# Patient Record
Sex: Female | Born: 1980 | Race: White | Hispanic: No | Marital: Single | Smoking: Current every day smoker
Health system: Southern US, Community
[De-identification: ages and names within clinical notes are randomized; demographics above are authoritative.]

## PROBLEM LIST (undated history)

## (undated) DIAGNOSIS — M8700 Idiopathic aseptic necrosis of unspecified bone: Secondary | ICD-10-CM

## (undated) DIAGNOSIS — J45909 Unspecified asthma, uncomplicated: Secondary | ICD-10-CM

## (undated) DIAGNOSIS — F319 Bipolar disorder, unspecified: Secondary | ICD-10-CM

## (undated) DIAGNOSIS — M25559 Pain in unspecified hip: Secondary | ICD-10-CM

## (undated) DIAGNOSIS — R748 Abnormal levels of other serum enzymes: Secondary | ICD-10-CM

## (undated) DIAGNOSIS — F32A Depression, unspecified: Secondary | ICD-10-CM

## (undated) DIAGNOSIS — G8929 Other chronic pain: Secondary | ICD-10-CM

## (undated) DIAGNOSIS — J189 Pneumonia, unspecified organism: Secondary | ICD-10-CM

## (undated) DIAGNOSIS — M549 Dorsalgia, unspecified: Secondary | ICD-10-CM

## (undated) DIAGNOSIS — R569 Unspecified convulsions: Secondary | ICD-10-CM

## (undated) DIAGNOSIS — F4310 Post-traumatic stress disorder, unspecified: Secondary | ICD-10-CM

## (undated) DIAGNOSIS — M1990 Unspecified osteoarthritis, unspecified site: Secondary | ICD-10-CM

## (undated) DIAGNOSIS — N289 Disorder of kidney and ureter, unspecified: Secondary | ICD-10-CM

## (undated) DIAGNOSIS — J42 Unspecified chronic bronchitis: Secondary | ICD-10-CM

## (undated) DIAGNOSIS — M5030 Other cervical disc degeneration, unspecified cervical region: Secondary | ICD-10-CM

## (undated) DIAGNOSIS — F419 Anxiety disorder, unspecified: Secondary | ICD-10-CM

## (undated) DIAGNOSIS — F329 Major depressive disorder, single episode, unspecified: Secondary | ICD-10-CM

## (undated) HISTORY — PX: JOINT REPLACEMENT: SHX530

## (undated) HISTORY — PX: WISDOM TOOTH EXTRACTION: SHX21

## (undated) HISTORY — PX: DILATION AND CURETTAGE OF UTERUS: SHX78

---
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
## 2013-04-16 DIAGNOSIS — J189 Pneumonia, unspecified organism: Secondary | ICD-10-CM

## 2013-04-16 HISTORY — DX: Pneumonia, unspecified organism: J18.9

## 2013-06-17 ENCOUNTER — Encounter (HOSPITAL_COMMUNITY): Payer: Self-pay | Admitting: Emergency Medicine

## 2013-06-17 ENCOUNTER — Emergency Department (EMERGENCY_DEPARTMENT_HOSPITAL)
Admission: EM | Admit: 2013-06-17 | Discharge: 2013-06-18 | Disposition: A | Discharge status: Home / Self Care | Payer: MEDICAID | Source: Home / Self Care | Attending: Emergency Medicine | Admitting: Emergency Medicine

## 2013-06-17 DIAGNOSIS — F3289 Other specified depressive episodes: Secondary | ICD-10-CM | POA: Insufficient documentation

## 2013-06-17 DIAGNOSIS — F17200 Nicotine dependence, unspecified, uncomplicated: Secondary | ICD-10-CM

## 2013-06-17 DIAGNOSIS — F329 Major depressive disorder, single episode, unspecified: Secondary | ICD-10-CM

## 2013-06-17 DIAGNOSIS — IMO0002 Reserved for concepts with insufficient information to code with codable children: Secondary | ICD-10-CM

## 2013-06-17 DIAGNOSIS — F332 Major depressive disorder, recurrent severe without psychotic features: Secondary | ICD-10-CM | POA: Diagnosis not present

## 2013-06-17 DIAGNOSIS — R45850 Homicidal ideations: Secondary | ICD-10-CM

## 2013-06-17 DIAGNOSIS — R45851 Suicidal ideations: Secondary | ICD-10-CM

## 2013-06-17 DIAGNOSIS — F39 Unspecified mood [affective] disorder: Secondary | ICD-10-CM

## 2013-06-17 DIAGNOSIS — F4310 Post-traumatic stress disorder, unspecified: Secondary | ICD-10-CM

## 2013-06-17 DIAGNOSIS — F411 Generalized anxiety disorder: Secondary | ICD-10-CM

## 2013-06-17 DIAGNOSIS — F3189 Other bipolar disorder: Secondary | ICD-10-CM | POA: Insufficient documentation

## 2013-06-17 HISTORY — DX: Bipolar disorder, unspecified: F31.9

## 2013-06-17 HISTORY — DX: Depression, unspecified: F32.A

## 2013-06-17 HISTORY — DX: Post-traumatic stress disorder, unspecified: F43.10

## 2013-06-17 HISTORY — DX: Major depressive disorder, single episode, unspecified: F32.9

## 2013-06-17 LAB — RAPID URINE DRUG SCREEN, HOSP PERFORMED
AMPHETAMINES: NOT DETECTED
BENZODIAZEPINES: POSITIVE — AB
Barbiturates: NOT DETECTED
Cocaine: NOT DETECTED
OPIATES: NOT DETECTED
Tetrahydrocannabinol: NOT DETECTED

## 2013-06-17 LAB — SALICYLATE LEVEL: Salicylate Lvl: <2 mg/dL — ABNORMAL LOW (ref 2.8–20.0)

## 2013-06-17 LAB — CBC
HCT: 38.2 % (ref 36.0–46.0)
Hemoglobin: 13 g/dL (ref 12.0–15.0)
MCH: 32.6 pg (ref 26.0–34.0)
MCHC: 34 g/dL (ref 30.0–36.0)
MCV: 95.7 fL (ref 78.0–100.0)
PLATELETS: 294 10*3/uL (ref 150–400)
RBC: 3.99 MIL/uL (ref 3.87–5.11)
RDW: 13.6 % (ref 11.5–15.5)
WBC: 8.6 10*3/uL (ref 4.0–10.5)

## 2013-06-17 LAB — COMPREHENSIVE METABOLIC PANEL
ALT: 18 U/L (ref 0–35)
AST: 28 U/L (ref 0–37)
Albumin: 4 g/dL (ref 3.5–5.2)
Alkaline Phosphatase: 60 U/L (ref 39–117)
BUN: 8 mg/dL (ref 6–23)
CALCIUM: 9.1 mg/dL (ref 8.4–10.5)
CO2: 25 mEq/L (ref 19–32)
Chloride: 110 mEq/L (ref 96–112)
Creatinine, Ser: 0.86 mg/dL (ref 0.50–1.10)
GFR calc Af Amer: >90 mL/min (ref >90)
GFR calc non Af Amer: 89 mL/min — ABNORMAL LOW (ref >90)
Glucose, Bld: 77 mg/dL (ref 70–99)
Potassium: 4.2 mEq/L (ref 3.7–5.3)
Sodium: 148 mEq/L — ABNORMAL HIGH (ref 137–147)
Total Bilirubin: <0.2 mg/dL — ABNORMAL LOW (ref 0.3–1.2)
Total Protein: 7 g/dL (ref 6.0–8.3)

## 2013-06-17 LAB — ACETAMINOPHEN LEVEL: Acetaminophen (Tylenol), Serum: <15 ug/mL (ref 10–30)

## 2013-06-17 LAB — ETHANOL

## 2013-06-17 LAB — PREGNANCY, URINE: Preg Test, Ur: NEGATIVE

## 2013-06-17 MED ORDER — LORAZEPAM 1 MG PO TABS
1.0000 mg | ORAL_TABLET | Freq: Once | ORAL | Status: AC
  Administered 2013-06-17: 1 mg via ORAL
  Filled 2013-06-17: qty 1

## 2013-06-17 MED ORDER — BUSPIRONE HCL 10 MG PO TABS
15.0000 mg | ORAL_TABLET | Freq: Three times a day (TID) | ORAL | Status: DC
  Administered 2013-06-17: 15 mg via ORAL
  Filled 2013-06-17: qty 2

## 2013-06-17 MED ORDER — ACETAMINOPHEN 325 MG PO TABS: 650.0000 mg | ORAL_TABLET | ORAL | Status: DC | PRN

## 2013-06-17 MED ORDER — VITAMIN B-12 100 MCG PO TABS
50.0000 ug | ORAL_TABLET | Freq: Every day | ORAL | Status: DC
  Administered 2013-06-17 – 2013-06-18 (×2): 50 ug via ORAL
  Filled 2013-06-17 (×2): qty 1

## 2013-06-17 MED ORDER — BIOTIN 1 MG PO CAPS: 1.0000 mg | ORAL_CAPSULE | Freq: Every day | ORAL | Status: DC

## 2013-06-17 MED ORDER — CLONAZEPAM 1 MG PO TABS
2.0000 mg | ORAL_TABLET | Freq: Four times a day (QID) | ORAL | Status: DC
  Administered 2013-06-17: 2 mg via ORAL
  Filled 2013-06-17: qty 2

## 2013-06-17 MED ORDER — IBUPROFEN 200 MG PO TABS
600.0000 mg | ORAL_TABLET | Freq: Three times a day (TID) | ORAL | Status: DC | PRN
  Administered 2013-06-18 (×2): 600 mg via ORAL
  Filled 2013-06-17 (×2): qty 3

## 2013-06-17 MED ORDER — PRENATAL MULTIVITAMIN CH
1.0000 | ORAL_TABLET | Freq: Every day | ORAL | Status: DC
  Administered 2013-06-17 – 2013-06-18 (×2): 1 via ORAL
  Filled 2013-06-17 (×2): qty 1

## 2013-06-17 MED ORDER — ALUM & MAG HYDROXIDE-SIMETH 200-200-20 MG/5ML PO SUSP: 30.0000 mL | ORAL | Status: DC | PRN

## 2013-06-17 MED ORDER — PSEUDOEPHEDRINE HCL 30 MG PO TABS
30.0000 mg | ORAL_TABLET | ORAL | Status: DC | PRN
  Filled 2013-06-17: qty 1

## 2013-06-17 MED ORDER — ZOLPIDEM TARTRATE 5 MG PO TABS
5.0000 mg | ORAL_TABLET | Freq: Once | ORAL | Status: AC
  Administered 2013-06-17: 5 mg via ORAL
  Filled 2013-06-17: qty 1

## 2013-06-17 MED ORDER — B COMPLEX-C PO TABS
1.0000 | ORAL_TABLET | Freq: Every day | ORAL | Status: DC
  Administered 2013-06-17 – 2013-06-18 (×2): 1 via ORAL
  Filled 2013-06-17 (×2): qty 1

## 2013-06-17 MED ORDER — BUSPIRONE HCL 10 MG PO TABS
10.0000 mg | ORAL_TABLET | Freq: Three times a day (TID) | ORAL | Status: DC
  Administered 2013-06-17 – 2013-06-18 (×4): 10 mg via ORAL
  Filled 2013-06-17 (×4): qty 1

## 2013-06-17 MED ORDER — NICOTINE 21 MG/24HR TD PT24
21.0000 mg | MEDICATED_PATCH | Freq: Every day | TRANSDERMAL | Status: DC
  Administered 2013-06-17 – 2013-06-18 (×2): 21 mg via TRANSDERMAL
  Filled 2013-06-17 (×2): qty 1

## 2013-06-17 MED ORDER — MAGNESIUM OXIDE 400 (241.3 MG) MG PO TABS
30.0000 mg | ORAL_TABLET | Freq: Every day | ORAL | Status: DC
  Administered 2013-06-17: 400 mg via ORAL
  Administered 2013-06-18: 200 mg via ORAL
  Filled 2013-06-17 (×2): qty 0.5

## 2013-06-17 MED ORDER — ONDANSETRON HCL 4 MG PO TABS: 4.0000 mg | ORAL_TABLET | Freq: Three times a day (TID) | ORAL | Status: DC | PRN

## 2013-06-17 MED ORDER — LAMOTRIGINE 200 MG PO TABS
200.0000 mg | ORAL_TABLET | Freq: Every day | ORAL | Status: DC
  Administered 2013-06-17 – 2013-06-18 (×2): 200 mg via ORAL
  Filled 2013-06-17 (×2): qty 1

## 2013-06-17 MED ORDER — TOPIRAMATE 25 MG PO TABS
100.0000 mg | ORAL_TABLET | Freq: Every day | ORAL | Status: DC
  Administered 2013-06-17: 100 mg via ORAL
  Filled 2013-06-17 (×2): qty 4

## 2013-06-17 MED ORDER — CLONAZEPAM 1 MG PO TABS
1.0000 mg | ORAL_TABLET | Freq: Four times a day (QID) | ORAL | Status: DC | PRN
  Administered 2013-06-17 – 2013-06-18 (×4): 1 mg via ORAL
  Filled 2013-06-17 (×4): qty 1

## 2013-06-17 NOTE — ED Notes (Signed)
Patient medications reviewed. Patient tearful and irritable with this writer concerning doses of medications.  "You don't understand, they cut my doses of medicine in half. I had been on them for three years."  Support and encouragement offerd.  Refusing food.

## 2013-06-17 NOTE — ED Notes (Signed)
Patient changed into paper scrubs, green booties.  All belongings in 3 plastic bags.  Patient moved to triage 4, wanded by security.

## 2013-06-17 NOTE — ED Notes (Signed)
Patient escorted to the unit by [facility] driver.  Gait steady.  Pt cooperates with the assessment. However, she is irritable and agitated.  Denies SI/HI at this time.  Endorses visual hallucinations.  Reports "seeing deceased people from her past...sort of like zombies but not really".  She also reports attempting to sit in a chair that was not actually there and falling because of that.  Denies auditory hallucinations. She stares off into the distance as if seeing or hearing something.  She has a blunted affect and monotone voice.  Reports having a headache for the past month that has not resolved.  States that she usually takes Motrin.  Patients medications have not been loaded at this time therefore, she has not been medicated for the HA.

## 2013-06-17 NOTE — ED Notes (Signed)
Stated she has been more depressed within the last year
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
## 2013-06-17 NOTE — ED Notes (Signed)
Patient is focused on her medication dosing and timing. Patient reports that she needs her medications to match her home schedule because "my body has been on the same schedule for three years". Patient states that the previous shift nurse "messed up my meds" by not giving her what she wanted when she wanted. Patient states that no doctor discussed her medications/schedule with her. Patient requests a complaint form. States that she will not be "stabilized" until her medications are right.  Encouragement offered. Nicotine Patch given.  Patient safety maintained, Q15 checks continue.

## 2013-06-17 NOTE — Progress Notes (Signed)
PHARMACIST - PHYSICIAN ORDER COMMUNICATION  CONCERNING: P&T Medication Policy on Herbal Medications  DESCRIPTION:  This patient's order for:  Biotin  has been noted.  This product(s) is classified as an "herbal" or natural product. Due to a lack of definitive safety studies or FDA approval, nonstandard manufacturing practices, plus the potential risk of unknown drug-drug interactions while on inpatient medications, the Pharmacy and Therapeutics Committee does not permit the use of "herbal" or natural products of this type within [facility].   ACTION TAKEN: The pharmacy department is unable to verify this order at this time and your patient has been informed of this safety policy. Please reevaluate patient's clinical condition at discharge and address if the herbal or natural product(s) should be resumed at that time.  Tomy Khim, Loma MessingMary Patricia PharmD Pager #: [phone] 7:28 AM 06/17/2013

## 2013-06-17 NOTE — Consult Note (Signed)
Face to face evaluation and I agree with this note 
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
## 2013-06-17 NOTE — ED Notes (Signed)
Pt sts she can "probably" contract for safety
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
## 2013-06-17 NOTE — ED Notes (Signed)
[facility]

## 2013-06-17 NOTE — ED Notes (Signed)
Patient with flat affect.
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
## 2013-06-17 NOTE — ED Notes (Signed)
House coverage and charge RN aware of need for sitter 

## 2013-06-17 NOTE — ED Notes (Signed)
Patient became angry with this Clinical research associatewriter when informed of inpatient information. Demanded medication schedule changes.  Demanded an inpatient private room.  Twisted conversation to make it appear i had belittled her.  Informed patient that she needed to ask for klonopin now that it was PRN. Patient irritable and entitled.

## 2013-06-17 NOTE — ED Notes (Signed)
Call [facility] transport service [facility]

## 2013-06-17 NOTE — ED Notes (Signed)
Pt requesting eval with in person psychiatrist and for area without sitter present; informed pt that could not be provided at this facility

## 2013-06-18 ENCOUNTER — Encounter (HOSPITAL_COMMUNITY): Payer: Self-pay

## 2013-06-18 ENCOUNTER — Inpatient Hospital Stay (HOSPITAL_COMMUNITY)
Admission: AD | Admit: 2013-06-18 | Discharge: 2013-06-24 | DRG: 885 | Disposition: A | Discharge status: Home / Self Care | Payer: MEDICAID | Source: Intra-hospital | Attending: Psychiatry | Admitting: Psychiatry

## 2013-06-18 DIAGNOSIS — F4312 Post-traumatic stress disorder, chronic: Secondary | ICD-10-CM

## 2013-06-18 DIAGNOSIS — R45850 Homicidal ideations: Secondary | ICD-10-CM

## 2013-06-18 DIAGNOSIS — F410 Panic disorder [episodic paroxysmal anxiety] without agoraphobia: Secondary | ICD-10-CM | POA: Diagnosis present

## 2013-06-18 DIAGNOSIS — F4310 Post-traumatic stress disorder, unspecified: Secondary | ICD-10-CM | POA: Diagnosis present

## 2013-06-18 DIAGNOSIS — Z23 Encounter for immunization: Secondary | ICD-10-CM

## 2013-06-18 DIAGNOSIS — F329 Major depressive disorder, single episode, unspecified: Secondary | ICD-10-CM | POA: Diagnosis present

## 2013-06-18 DIAGNOSIS — G4700 Insomnia, unspecified: Secondary | ICD-10-CM | POA: Diagnosis present

## 2013-06-18 DIAGNOSIS — IMO0002 Reserved for concepts with insufficient information to code with codable children: Secondary | ICD-10-CM | POA: Diagnosis not present

## 2013-06-18 DIAGNOSIS — F332 Major depressive disorder, recurrent severe without psychotic features: Secondary | ICD-10-CM

## 2013-06-18 DIAGNOSIS — R45851 Suicidal ideations: Secondary | ICD-10-CM | POA: Diagnosis not present

## 2013-06-18 DIAGNOSIS — F319 Bipolar disorder, unspecified: Secondary | ICD-10-CM | POA: Diagnosis present

## 2013-06-18 DIAGNOSIS — F411 Generalized anxiety disorder: Secondary | ICD-10-CM | POA: Diagnosis present

## 2013-06-18 DIAGNOSIS — F17200 Nicotine dependence, unspecified, uncomplicated: Secondary | ICD-10-CM | POA: Diagnosis present

## 2013-06-18 MED ORDER — INFLUENZA VAC SPLIT QUAD 0.5 ML IM SUSP
0.5000 mL | INTRAMUSCULAR | Status: DC
  Filled 2013-06-18: qty 0.5

## 2013-06-18 MED ORDER — MAGNESIUM HYDROXIDE 400 MG/5ML PO SUSP: 30.0000 mL | Freq: Every day | ORAL | Status: DC | PRN

## 2013-06-18 MED ORDER — PRENATAL MULTIVITAMIN CH
1.0000 | ORAL_TABLET | Freq: Every day | ORAL | Status: DC
  Administered 2013-06-19 – 2013-06-24 (×5): 1 via ORAL
  Filled 2013-06-18 (×7): qty 1

## 2013-06-18 MED ORDER — CLONAZEPAM 1 MG PO TABS
1.0000 mg | ORAL_TABLET | Freq: Four times a day (QID) | ORAL | Status: DC | PRN
  Administered 2013-06-18 – 2013-06-24 (×18): 1 mg via ORAL
  Filled 2013-06-18 (×20): qty 1

## 2013-06-18 MED ORDER — MAGNESIUM OXIDE 400 (241.3 MG) MG PO TABS: 30.0000 mg | ORAL_TABLET | Freq: Every day | ORAL | Status: DC

## 2013-06-18 MED ORDER — B COMPLEX-C PO TABS
1.0000 | ORAL_TABLET | Freq: Every day | ORAL | Status: DC
  Administered 2013-06-19 – 2013-06-24 (×6): 1 via ORAL
  Filled 2013-06-18 (×8): qty 1

## 2013-06-18 MED ORDER — LAMOTRIGINE 200 MG PO TABS
200.0000 mg | ORAL_TABLET | Freq: Every day | ORAL | Status: DC
  Administered 2013-06-19 – 2013-06-24 (×6): 200 mg via ORAL
  Filled 2013-06-18 (×2): qty 1
  Filled 2013-06-18: qty 2
  Filled 2013-06-18 (×6): qty 1

## 2013-06-18 MED ORDER — VITAMIN B-12 100 MCG PO TABS
50.0000 ug | ORAL_TABLET | Freq: Every day | ORAL | Status: DC
  Administered 2013-06-19 – 2013-06-24 (×6): 50 ug via ORAL
  Filled 2013-06-18 (×8): qty 1

## 2013-06-18 MED ORDER — BUSPIRONE HCL 10 MG PO TABS
10.0000 mg | ORAL_TABLET | Freq: Three times a day (TID) | ORAL | Status: DC
  Administered 2013-06-19 – 2013-06-21 (×7): 10 mg via ORAL
  Filled 2013-06-18 (×10): qty 1
  Filled 2013-06-18: qty 2
  Filled 2013-06-18 (×3): qty 1

## 2013-06-18 MED ORDER — TRAZODONE HCL 100 MG PO TABS: 100.0000 mg | ORAL_TABLET | Freq: Every evening | ORAL | Status: DC | PRN

## 2013-06-18 MED ORDER — NICOTINE 21 MG/24HR TD PT24
21.0000 mg | MEDICATED_PATCH | Freq: Every day | TRANSDERMAL | Status: DC
  Administered 2013-06-19 – 2013-06-24 (×6): 21 mg via TRANSDERMAL
  Filled 2013-06-18 (×10): qty 1

## 2013-06-18 MED ORDER — MAGNESIUM GLUCONATE 500 MG PO TABS
500.0000 mg | ORAL_TABLET | Freq: Every day | ORAL | Status: DC
  Administered 2013-06-19: 08:00:00 via ORAL
  Administered 2013-06-20 – 2013-06-24 (×5): 500 mg via ORAL
  Filled 2013-06-18 (×8): qty 1

## 2013-06-18 MED ORDER — TRAZODONE HCL 100 MG PO TABS
100.0000 mg | ORAL_TABLET | Freq: Every evening | ORAL | Status: DC | PRN
  Administered 2013-06-18: 100 mg via ORAL
  Filled 2013-06-18: qty 1

## 2013-06-18 MED ORDER — PSEUDOEPHEDRINE HCL 30 MG PO TABS
30.0000 mg | ORAL_TABLET | ORAL | Status: DC | PRN
  Administered 2013-06-19 – 2013-06-20 (×7): 30 mg via ORAL
  Filled 2013-06-18 (×6): qty 1
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
## 2013-06-18 NOTE — Consult Note (Signed)
Review of Systems   Constitutional: Negative.    HENT: Negative.    Eyes: Negative.    Respiratory: Negative.    Cardiovascular: Negative.    Gastrointestinal: Negative.    Genitourinary: Negative.    Musculoskeletal: Negative.    Skin: Negative.    Neurological: Negative.    Endo/Heme/Allergies: Negative.    Psychiatric/Behavioral: Negative.

## 2013-06-18 NOTE — ED Notes (Signed)
Pt up to nurses station questioning her medication times. Pt asked to have her 10 am meds be given at 9am,

## 2013-06-18 NOTE — Progress Notes (Signed)
TTS staff  will refer patient to other facilities this morning after Dr. Taylor has assessed the patient.     No [address] at [facility] until after TX Team meeting at 11am 

## 2013-06-18 NOTE — ED Notes (Signed)
Pt states her thoughts of SI and HI are coming and going this morning. Pt took a few minutes to answer when asked. Pt was given tylenol for a headache.

## 2013-06-18 NOTE — ED Notes (Signed)
[facility] [facility].

## 2013-06-18 NOTE — Progress Notes (Addendum)
Patient was declined at [facility].

## 2013-06-18 NOTE — ED Notes (Signed)
Patient was given her scheduled dose of buspirone.  She had been sound asleep and I had to speak quite loudly to wake her up.  She took the medication and asked if she could have her clonazepam as well.  I advised her that she was getting an antianxiety medication and could not have both of them together.  She became angry and asked to speak with the charge nurse who supported my decision.  He also spoke with the PA who supported our decision.  Patient was told she needed to wait until at least 5 pm to receive more medication for anxiety.

## 2013-06-18 NOTE — ED Notes (Signed)
Adult Psychoeducational Group Note  Date:  06/18/2013 Time:  12:08 PM  Group Topic/Focus:  Overcoming Stress:   The focus of this group is to define stress and help patients assess their triggers.  Participation Level:  Minimal  Participation Quality:  Resistant  Affect:  Blunted  Cognitive:  Alert  Insight: Limited  Engagement in Group:  Resistant  Modes of Intervention:  Education  Additional Comments:  Patient briefly, approximately ten minutes, attended group on Stress. Patient refused offers to sit and acted paranoid. Patient stood in the doorway during group and looked over her shoulder frequently as though she was waiting for something. Patient was asked if she was waiting for her nurse, patient responded she was not. Patient stated she was stressed because her daughter's birthday was Monday and she would not be able to be there for it. Patient stated she enjoyed painting and it helped her when she was stressed. Patient crinkled and tore stress interview worksheet while in group and left in a frustrated hurry.  Merleen MillinerCataldo, Airianna Kreischer Y 06/18/2013, 12:08 PM

## 2013-06-18 NOTE — Progress Notes (Signed)
[facility]

## 2013-06-18 NOTE — Tx Team (Signed)
Initial Interdisciplinary Treatment Plan  PATIENT STRENGTHS: (choose at least two) Capable of independent living General fund of knowledge  PATIENT STRESSORS: Financial difficulties Health problems Marital or family conflict Medication change or noncompliance   PROBLEM LIST: Problem List/Patient Goals Date to be addressed Date deferred Reason deferred Estimated date of resolution  Depression  Mood disorder                                                       DISCHARGE CRITERIA:  Improved stabilization in mood, thinking, and/or behavior Motivation to continue treatment in a less acute level of care Verbal commitment to aftercare and medication compliance  PRELIMINARY DISCHARGE PLAN: Attend aftercare/continuing care group Outpatient therapy Return to previous living arrangement  PATIENT/FAMIILY INVOLVEMENT: This treatment plan has been presented to and reviewed with the patient, Theresa Mckay, and/or family member, .  The patient and family have been given the opportunity to ask questions and make suggestions.  Theresa Mckay, Theresa Mckay 06/18/2013, 11:42 PM

## 2013-06-18 NOTE — Consult Note (Signed)
  Psychiatric Specialty Exam: Physical Exam  ROS  Blood pressure 103/68, pulse 81, temperature 98.1 F (36.7 C), temperature source Oral, resp. rate 19, last menstrual period 06/17/2013, SpO2 97.00%.There is no height or weight on file to calculate BMI.  General Appearance: Casual  Eye Contact::  Good  Speech:  Clear and Coherent  Volume:  Normal  Mood:  Anxious  Affect:  Appropriate  Thought Process:  Goal Directed and Logical  Orientation:  Full (Time, Place, and Person)  Thought Content:  Negative  Suicidal Thoughts:  Yes.  with intent/plan  Homicidal Thoughts:  Yes.  without intent/plan  Memory:  Immediate;   Good Recent;   Good Remote;   Good  Judgement:  Intact  Insight:  Good  Psychomotor Activity:  Normal  Concentration:  Good  Recall:  Good  Akathisia:  Negative  Handed:  Right  AIMS (if indicated):     Assets:  Communication Skills Desire for Improvement Housing Physical Health Social Support Talents/Skills Transportation  Sleep:   adequate  Theresa Mckay remains the same.  Still has suicidal thoughts and is uncomfortable being discharged as she is not sure she would be safe from herself.  The plan is to still seek inpatient bed.

## 2013-06-18 NOTE — Progress Notes (Signed)
CSW met with patient to discuss voluntary transfer to [facility].  CSW clearly explained the consent forms to the patient and she willingly signed.  Patient is currently in agreement with transfer and treatment options.     Chesley Noon, MSW, LCSWA, 06/18/2013, 4:44 PM Evening Clinical Social Worker [phone]

## 2013-06-18 NOTE — Progress Notes (Signed)
[facility]

## 2013-06-19 DIAGNOSIS — F4310 Post-traumatic stress disorder, unspecified: Secondary | ICD-10-CM

## 2013-06-19 DIAGNOSIS — R45851 Suicidal ideations: Secondary | ICD-10-CM

## 2013-06-19 DIAGNOSIS — R45850 Homicidal ideations: Secondary | ICD-10-CM

## 2013-06-19 DIAGNOSIS — F4312 Post-traumatic stress disorder, chronic: Secondary | ICD-10-CM

## 2013-06-19 DIAGNOSIS — F3130 Bipolar disorder, current episode depressed, mild or moderate severity, unspecified: Secondary | ICD-10-CM

## 2013-06-19 MED ORDER — RAMELTEON 8 MG PO TABS
8.0000 mg | ORAL_TABLET | Freq: Every day | ORAL | Status: DC
  Administered 2013-06-19 – 2013-06-20 (×2): 8 mg via ORAL
  Filled 2013-06-19 (×5): qty 1

## 2013-06-19 MED ORDER — IBUPROFEN 600 MG PO TABS
600.0000 mg | ORAL_TABLET | Freq: Four times a day (QID) | ORAL | Status: DC | PRN
  Administered 2013-06-19 – 2013-06-24 (×13): 600 mg via ORAL
  Filled 2013-06-19 (×13): qty 1
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
## 2013-06-19 NOTE — Progress Notes (Signed)
Adult Psychoeducational Group Note  Date:  06/19/2013 Time:  9:30 PM  Group Topic/Focus:  Goals Group:   The focus of this group is to help patients establish daily goals to achieve during treatment and discuss how the patient can incorporate goal setting into their daily lives to aide in recovery.  Participation Level:  Active  Participation Quality:  Appropriate  Affect:  Appropriate  Cognitive:  Appropriate  Insight: Appropriate  Engagement in Group:  Engaged  Modes of Intervention:  Discussion  Additional Comments:  Pt stated that her day was good because of the help from her nurse and that made things a lot easier today.  Terie PurserParker, Kimmora Risenhoover R 06/19/2013, 9:30 PM
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
## 2013-06-19 NOTE — [facility] Suicide Risk Assessment (Signed)
   Nursing information obtained from:    Demographic factors:    Current Mental Status:    Loss Factors:    Historical Factors:    Risk Reduction Factors:    Total Time spent with patient: 45 minutes  CLINICAL FACTORS:   Severe Anxiety and/or Agitation Bipolar Disorder:   Depressive phase Depression:   Anhedonia Hopelessness Impulsivity Insomnia Recent sense of peace/wellbeing Severe Personality Disorders:   Cluster B Unstable or Poor Therapeutic Relationship Previous Psychiatric Diagnoses and Treatments Medical Diagnoses and Treatments/Surgeries  Psychiatric Specialty Exam: Physical Exam  ROS  Blood pressure 117/74, pulse 80, temperature 98.6 F (37 C), temperature source Oral, resp. rate 20, height 5\' 6"  (1.676 m), weight 91.627 kg (202 lb), last menstrual period 06/17/2013.Body mass index is 32.62 kg/(m^2).  General Appearance: Disheveled and Guarded  Eye SolicitorContact::  Fair  Speech:  Clear and Coherent and Slow  Volume:  Decreased  Mood:  Anxious, Depressed, Hopeless, Irritable and Worthless  Affect:  Constricted, Depressed and Labile  Thought Process:  Goal Directed and Intact  Orientation:  Full (Time, Place, and Person)  Thought Content:  Paranoid Ideation  Suicidal Thoughts:  Yes.  with intent/plan  Homicidal Thoughts:  No  Memory:  Immediate;   Fair  Judgement:  Impaired  Insight:  Lacking  Psychomotor Activity:  Psychomotor Retardation  Concentration:  Fair  Recall:  Fair  Fund of Knowledge:Fair  Language: Good  Akathisia:  NA  Handed:  Right  AIMS (if indicated):     Assets:  Communication Skills Desire for Improvement Financial Resources/Insurance Physical Health Resilience Social Support  Sleep:  Number of Hours: 5.25   Musculoskeletal: Strength & Muscle Tone: within normal limits Gait & Station: normal Patient leans: N/A  COGNITIVE FEATURES THAT CONTRIBUTE TO RISK:  Closed-mindedness Loss of executive function Polarized thinking Thought  constriction (tunnel vision)    SUICIDE RISK:   Moderate:  Frequent suicidal ideation with limited intensity, and duration, some specificity in terms of plans, no associated intent, good self-control, limited dysphoria/symptomatology, some risk factors present, and identifiable protective factors, including available and accessible social support.  PLAN OF CARE: Admitted for crisis stabilization, safety monitoring and medication management of major depressive disorder with suicidal ideation.  I certify that inpatient services furnished can reasonably be expected to improve the patient's condition.  Kyl Givler,JANARDHAHA R. 06/19/2013, 12:20 PM

## 2013-06-19 NOTE — [facility] Group Notes (Signed)
[facility]   06/19/2013 10:31 AM    Participation Quality:  Appropraite  Mood/Affect:  Appropriate  Depression Rating:  10  Anxiety Rating:  10  Thoughts of Suicide:  No  Will you contract for safety?   NA  Current AVH:  No  Plan for Discharge/Comments:  Patient attended discharge planning group and actively participated in group.  [place] and [place] outpatient providers and a place to live.  CSW provided all participants with daily workbook.   Transportation Means: Patient has transportation.   Supports:  Patient has a support system.   Oneal Biglow, Joesph JulyQuylle Hairston

## 2013-06-19 NOTE — [facility] Group Notes (Signed)
[facility] LCSW Group Therapy  Feelings Around Relapse 1:15 -2:30        06/19/2013  4:28 PM   Type of Therapy:  Group Therapy  Participation Level:  Appropriate  Participation Quality:  Appropriate  Affect:  Appropriate  Cognitive:  Attentive Appropriate  Insight:  Engaged  Engagement in Therapy:  Engaged  Modes of Intervention:  Discussion Exploration Problem-Solving Supportive  Summary of Progress/Problems:  The topic for today was feelings around relapse.  Patient discussed what relapse prevention is to them and identified triggers that are on the patient to relapse.  Patient processed his/her feelings toward relapse and was able to related to peers. Patient identified coping skills that can be used to prevent a relapse.   Wynn BankerHodnett, Lance Galas Hairston 06/19/2013 4:28 PM
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
## 2013-06-19 NOTE — Progress Notes (Signed)
D: Patient presents with anxious affect and mood. She reported on the self inventory sheet that her sleep is poor, appetite and ability to pay attention are both good, and energy level is normal. Patient rated depression and feelings of hopelessness "10". She's attending groups and visible in the milieu. Patient adheres to medication regimen.  A: Support and encouragement provided to patient. Scheduled medications administered per MD orders. Maintain Q15 minute checks for safety.  R: Patient receptive. Passive SI, but contracts for safety. Denies HI and AVH. Patient remains safe.

## 2013-06-20 NOTE — Progress Notes (Signed)
Pt observed sitting in the dayroom to herself with no interaction with peers.  Pt reports she is still anxious and c/o a headache that she has had most of the day.  Pt requested Ibuprofen because Tylenol does not work.  She also ask that the trazodone be changed to something else because it did not work.  Writer notified NP who changed the trazodone to Rozerem 8mg  and also ordered the Motrin.  Pt was given the Ibuprofen along with a prn klonopin for her anxiety.  Pt was informed that her sleep aid had been changed.  Educated pt on the new medication for sleep.  Pt said she wanted to be able to sleep, but is apprehensive about taking meds for sleep.  Pt assured that she would be monitored through the night for any side effects.  Pt denies SI/HI/AV.  Pt makes her needs known to staff.  Pt attended evening groups.  Support and encouragement offered.  Safety maintained with q15 minute checks.

## 2013-06-20 NOTE — Progress Notes (Signed)
Adult Psychoeducational Group Note  Date:  06/20/2013 Time:  3:15PM  Group Topic/Focus:  Self Care:   The focus of this group is to help patients understand the importance of self-care in order to improve or restore emotional, physical, spiritual, interpersonal, and financial health.  Participation Level:  Did Not Attend  Additional Comments:  Pt opted not to attend the group session.   Zacarias PontesSmith, Nakyia Dau R 06/20/2013, 5:44 PM
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
## 2013-06-20 NOTE — [facility] Group Notes (Signed)
[facility] Group Notes: (Clinical Social Work)   06/20/2013      Type of Therapy:  Group Therapy   Participation Level:  Did Not Attend    Ambrose MantleMareida Grossman-Orr, LCSW 06/20/2013, 4:37 PM

## 2013-06-20 NOTE — Progress Notes (Signed)
The focus of this group is to help patients review their daily goal of treatment and discuss progress on daily workbooks. Pt attended the evening group session and responded to all discussion prompts from the Writer. Pt shared that today was a bad day on the unit due to, "No one listens to me and they won't put me on the right medications. I've told them what works and they don't care." Pt's attitude was negative and she made negative comments about the Staff under her breath when others spoke. Pt reported having no additional needs from Nursing Staff this evening. "I have needs, but nothing you could help me with." Pt encouraged to give feedback about her medications to both her RN's and the physicians, but Pt was not receptive to this suggestion.

## 2013-06-20 NOTE — [facility] Group Notes (Signed)
[facility] Group Notes:  (Nursing/MHT/Case Management/Adjunct)  Date:  06/20/2013  Time:  10:01 AM  Type of Therapy:  Nurse Education  Participation Level:  Active  Participation Quality:  Appropriate  Affect:  Appropriate  Cognitive:  Alert, Appropriate and Oriented  Insight:  Appropriate and Good  Engagement in Group:  Engaged  Modes of Intervention:  Education  Summary of Progress/Problems:  Sleep hygiene nurse education group was completed.  Pt reported she does not sleep well.  Reported medications were helpful last night.  Pt discussed the benefits of yoga as it relates to a better quality of sleep.  Hoover BrownsJones, Youssouf Shipley Howard 06/20/2013, 10:01 AM

## 2013-06-21 MED ORDER — DOXEPIN HCL 25 MG PO CAPS
25.0000 mg | ORAL_CAPSULE | Freq: Every evening | ORAL | Status: DC | PRN
  Administered 2013-06-21 (×2): 25 mg via ORAL
  Filled 2013-06-21 (×8): qty 1

## 2013-06-21 MED ORDER — BUSPIRONE HCL 15 MG PO TABS
15.0000 mg | ORAL_TABLET | Freq: Three times a day (TID) | ORAL | Status: DC
  Administered 2013-06-22 – 2013-06-24 (×8): 15 mg via ORAL
  Filled 2013-06-21 (×13): qty 1

## 2013-06-21 MED ORDER — LORATADINE 10 MG PO TABS
10.0000 mg | ORAL_TABLET | Freq: Every day | ORAL | Status: DC
  Administered 2013-06-22 – 2013-06-24 (×3): 10 mg via ORAL
  Filled 2013-06-21 (×5): qty 1

## 2013-06-21 NOTE — [facility] Group Notes (Signed)
[facility] Group Notes:  (Clinical Social Work)  06/21/2013   1:15-2:15PM  Summary of Progress/Problems:  The main focus of today's process group was to identify the patient's current support system and decide on other supports that can be put in place.  The picture on workbook was used to discuss why additional supports are needed.  An emphasis was placed on using counselor, doctor, therapy groups, 12-step groups, and problem-specific support groups to expand supports.   There was also an extensive discussion about what constitutes a healthy support versus an unhealthy support.  The patient expressed full comprehension of the concepts presented, and agreed that there is a need to add more supports.  The patient stated the current supports in place are her best friend, her 3yo daughter, and her old [place] friends [place] that she has not seen for a long time, but can call anytime.  She was very attentive to the remainder of group and appeared to be interested, but had a flat affect, did not respond directly to anything that was being said.  Type of Therapy:  Process Group  Participation Level:  Active  Participation Quality:  Attentive   Affect:  Flat and Depressed  Cognitive:  Appropriate and Oriented  Insight:  Engaged  Engagement in Therapy:  Engaged  Modes of Intervention:  Education,  Support and [employer]Processing  Promiss Labarbera Grossman-Orr, LCSW 06/21/2013, 4:00pm
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
## 2013-06-21 NOTE — Progress Notes (Signed)
Patient ID: Theresa Mckay, female   DOB: 01/21/1981, 33 y.o.   MRN: [id number]  Patient approaching RN and obviously agitated and demanding RN review her medications with her. As RN opening chart pt was asked what medications she had questions about. Pt states "I said all of them, what did you not hear?" Pt states "the damned doctors here don't know what they are doing with my meds. I want to know what sleeping pills I am on, I'm not going to take them but I want to know what it is". Pt indecisive and labile, attempting to engage staff in an argument; staff did not participate in argument, but instead redirected pt to speak with MD with medication concerns.

## 2013-06-21 NOTE — [facility] Group Notes (Signed)
[facility] Group Notes:  (Nursing/MHT/Case Management/Adjunct)  Date:  06/21/2013  Time:  2:15 PM  Type of Therapy:  Psychoeducational Skills  Participation Level:  Minimal  Participation Quality:  Resistant  Affect:  Irritable  Cognitive:  Alert  Insight:  Limited  Engagement in Group:  Defensive and Resistant  Modes of Intervention:  Discussion, Education and Support  Summary of Progress/Problems:Participated in discussion and self inventory group.  Inda MerlinWilliams, Castiel Lauricella R 06/21/2013, 2:15 PM

## 2013-06-21 NOTE — Progress Notes (Signed)
Patient ID: Malachi BondsKristina Clapp, female   DOB: 08/24/1980, 33 y.o.   MRN: [id number] D)  Has been out on the hall and in the dayroom, interacting with select peers tonight.  Has been somewhat guarded in her responses and interactions with staff.  When asked about the tattoo with a large A on her chest, she said it was the antichrist sunrise, power to the people.  Didn't want to explain the tattoos on her hands that say "cash only".  Stated her allergies have been flaring up since she has been here, requested sudaphed, and klonopin,  and later motrin for a headache.  Watched some of the basketball game on tv before asking for the motrin, went to her room shortly after and got ready for bed. A)  Will continue to monitor for safety, continue to try to develop rapport, continue POC R)  Safety maintained.

## 2013-06-22 MED ORDER — DOXEPIN HCL 25 MG PO CAPS
50.0000 mg | ORAL_CAPSULE | Freq: Every evening | ORAL | Status: DC | PRN
  Administered 2013-06-22: 50 mg via ORAL

## 2013-06-22 MED ORDER — VENLAFAXINE HCL ER 37.5 MG PO CP24: 37.5000 mg | ORAL_CAPSULE | Freq: Every day | ORAL | Status: DC
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
## 2013-06-22 NOTE — Progress Notes (Signed)
D:  Patient's self inventory sheet, patient needs sleep medication, improving appetite, not sure on energy level, poor attention span.  Rated depression 3, hopeless 1, anxiety 7-8.  Has been agitated from medication being changed and received wrong treatment plan.  SI off/on and HI to others in past 24 hours.  Has experienced headache, blurred vision in past 24 hours.  Worst pain 5, zero pain goal.  Would like to go to trauma CBT/or/DBT.  No sleeping pills when she leaves.  May have problems taking meds after discharge.  Has toddler, difficult to take sleeping pills if child wakes up and needs her. A:  Medications administered per MD orders.  Emotional support and encouragement given patient. R:  Denied A/V hallucinations.  SI and HI, contracts for safety.  Will continue to monitor patient for safety with 15 minute checks.  Safety maintained.
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
## 2013-06-22 NOTE — [facility] Group Notes (Signed)
[facility]   06/22/2013 12:27 PM    Participation Quality:  Appropraite  Mood/Affect:  Appropriate  Depression Rating:  1  Anxiety Rating:  7  Thoughts of Suicide:  No  Will you contract for safety?   NA  Current AVH:  No  Plan for Discharge/Comments:  Patient attended discharge planning group and actively participated in group.  [facility] [place] [facility] [place] [facility] [place] [facility] [place]  CSW provided all participants with daily workbook.   Transportation Means: Patient has transportation.   Supports:  Patient has a support system.   Jaonna Word, Joesph JulyQuylle Hairston

## 2013-06-22 NOTE — Progress Notes (Addendum)
Pt very short with nurse this morning.  Pt stated she did not want nicotine patch at this time and was nurse putting in computer that she accepted patch.    Patient came back to nurse this morning, requesting new patch.  Old patch removed, new nicotine patch placed right arm.

## 2013-06-22 NOTE — Tx Team (Signed)
Interdisciplinary Treatment Plan Update   Date Reviewed:  06/22/2013  Time Reviewed:  10:52 AM  Progress in Treatment:   Attending groups: Yes Participating in groups: Yes Taking medication as prescribed: Yes  Tolerating medication: Yes Family/Significant other contact made:  No, but will ask patient for consent for collateral contact Patient understands diagnosis: Yes  Discussing patient identified problems/goals with staff: Yes Medical problems stabilized or resolved: Yes Denies suicidal/homicidal ideation: Yes Patient has not harmed self or others: Yes  For review of initial/current patient goals, please see plan of care.  Estimated Length of Stay: 2 - days  Reasons for Continued Hospitalization:  Anxiety Depression Medication stabilization Suicidal ideation  New Problems/Goals identified:    Discharge Plan or Barriers:   [place] [facility]  Additional Comments:   Patient signed 72 hour request for discharge which will be up on Wednesday 3/11 at 14:55  Attendees:  Patient:  06/22/2013 10:52 AM   Signature: Mervyn GayJ. Jonnalagadda, MD 06/22/2013 10:52 AM  Signature:  Claudette Headonrad Withrow, FNP 06/22/2013 10:52 AM  Signature:    Quintella ReichertBeverly Knight, RN 06/22/2013 10:52 AM  Signature: 06/22/2013 10:52 AM  Signature:  06/22/2013 10:52 AM  Signature:  Juline PatchQuylle Saveon Plant, LCSW 06/22/2013 10:52 AM  Signature:  Reyes Ivanhelsea Horton, LCSW 06/22/2013 10:52 AM  Signature:  Leisa LenzValerie Enoch, Care Coordinator [facility] 06/22/2013 10:52 AM  Signature:  06/22/2013 10:52 AM  Signature:  Marzetta Boardhrista Dopson, RN 06/22/2013  10:52 AM  Signature:   06/22/2013  10:52 AM  Signature: Chrisandra Nettersana Green, RN 06/22/2013  10:52 AM    Scribe for Treatment Team:   Juline PatchQuylle Nancy Manuele,  06/22/2013 10:52 AM

## 2013-06-23 DIAGNOSIS — F332 Major depressive disorder, recurrent severe without psychotic features: Principal | ICD-10-CM

## 2013-06-23 MED ORDER — DOXEPIN HCL 50 MG PO CAPS
50.0000 mg | ORAL_CAPSULE | Freq: Every day | ORAL | Status: DC
  Administered 2013-06-23: 50 mg via ORAL
  Filled 2013-06-23 (×3): qty 1

## 2013-06-23 NOTE — Progress Notes (Signed)
D: Patient sitting quietly in the dayroom on approach.  Patient states her day was ok.  Patient states she has been attending groups today.  Patient states the groups are repetitive because she has been to several inpatient facilities in the past.  Patient states she needs to stop listening to other and that is why she is in the hospital anyway.  Patient states she also needs to be mindful of the things around her.    Patient appears blunted and depressed.  Patient denies SI/HI and denies AVH.   A: Staff to monitor Q 15 mins for safety.  Encouragement and support offered.  Scheduled medications administered per orders. R: Patient remains safe on the unit.  Patient attended group tonight.  Patient visible on the unit and interacting with peers.  Patient taking administered medications.

## 2013-06-23 NOTE — Progress Notes (Signed)
D:  Patient's self inventory sheet, patient sleeps well, got her sleep aid too late at night, 8:00 p.m. would be a good time.  Improving appetite, normal energy level, improving attention span.  Rated depression 2, hopeless 1.  Has felt agitation from cutting medication down.  Did not/do not use any other drugs.  Denied SI.  Back is tightening up again, headaches in past 24 hours.  Worst pain 5, zero pain goal.  Plans to "attend out patient trauma CBT or DBT treatments.  Tax return, daycare, own place.  Continue to see MDs for meds with counseling on regular basis.  Does not want to take sleeping pills at home unless is a very low dose.

## 2013-06-23 NOTE — [facility] Group Notes (Signed)
[facility] LCSW Group Therapy      Feelings About Diagnosis 1:15 - 2:30 PM         06/23/2013  2:49 PM    Type of Therapy:  Group Therapy  Participation Level:  Active  Participation Quality:  Appropriate  Affect:  Appropriate  Cognitive:  Alert and Appropriate  Insight:  Developing/Improving and Engaged  Engagement in Therapy:  Developing/Improving and Engaged  Modes of Intervention:  Discussion, Education, Exploration, Problem-Solving, Rapport Building, Support  Summary of Progress/Problems:  Patient actively participated in group. Patient discussed past and present diagnosis and the effects it has had on  life.  Patient talked about family and society being judgmental and the stigma associated with having a mental health diagnosis.  She shared she has a lot of fear and paranoia around her diagnosis.  She shared she never knows how she will wake up the next day; depressed or manic.  She shared she is okay with manic but has a problems when she is depressed and co-workers want to know what is wrong that she is so quiet.  Wynn BankerHodnett, Christie Copley Hairston 06/23/2013  2:49 PM

## 2013-06-23 NOTE — Progress Notes (Signed)
Patient ID: Malachi BondsKristina Simonetti, female   DOB: 10/18/1980, 33 y.o.   MRN: [id number]  D: Pt appeared to be slightly agitated during the assessment.  Writer approached pt, introduced self and attempted to assess. Pt stated, "well can I get my meds since I'm already really late?" Writer explained that med was scheduled for 2200 and  2300, and was not late. However, Clinical research associatewriter apologized telling pt that med could've been given an hour before, but that Clinical research associatewriter was doing an admission. Writer explained that the med could've been given at 2100, but that Clinical research associatewriter was doing an adm. Pt informed the writer that her dr was supposed to change the med to an earlier time. After looking back in orders writer noticed that order was modified to prn after initial order check. However, informed pt that night meds are not usually given before 2100.  A:  Support and encouragement was offered. 15 min checks continued for safety.  R: Pt remains safe.

## 2013-06-23 NOTE — Progress Notes (Signed)
Recreation Therapy Notes   Animal-Assisted Activity/Therapy (AAA/T) Program Checklist/Progress Notes Patient Eligibility Criteria Checklist & Daily Group note for Rec Tx Intervention  Date: 03.10.2015 Time: 2:45pm Location: [address] Dayroom    AAA/T Program Assumption of Risk Form signed by Patient/ or Parent Legal Guardian yes  Patient is free of allergies or sever asthma yes  Patient reports no fear of animals yes  Patient reports no history of cruelty to animals yes   Patient understands his/her participation is voluntary yes  Patient washes hands before animal contact yes  Patient washes hands after animal contact yes  Behavioral Response: Engaged, Appropriate   Education: Hand Washing, Appropriate Animal Interaction   Education Outcome: Acknowledges understanding   Clinical Observations/Feedback: Patient interacted appropriately with dog team and group members.    Marykay Lexenise L Eliza Grissinger, LRT/CTRS   Mushka Laconte L 06/23/2013 4:05 PM

## 2013-06-23 NOTE — [facility] Suicide Risk Assessment (Signed)
[facility] INPATIENT:  Family/Significant Other Suicide Prevention Education  Suicide Prevention Education:  Education Completed  Theresa Mckay, Mother, has been identified by the patient as the family member/significant other with whom the patient will be residing, and identified as the person(s) who will aid the patient in the event of a mental health crisis (suicidal ideations/suicide attempt).  With written consent from the patient, the family member/significant other has been provided the following suicide prevention education, prior to the and/or following the discharge of the patient.  The suicide prevention education provided includes the following:  Suicide risk factors  Suicide prevention and interventions  National Suicide Hotline telephone number  [facility] assessment telephone number  [facility] Emergency Assistance 911  [facility] and/or Residential Mobile Crisis Unit telephone number  Request made of family/significant other to:  Remove weapons (e.g., guns, rifles, knives), all items previously/currently identified as safety concern. Mother advised patient does not have access to guns.  Remove drugs/medications (over-the-counter, prescriptions, illicit drugs), all items previously/currently identified as a safety concern.  The family member/significant other verbalizes understanding of the suicide prevention education information provided.  The family member/significant other agrees to remove the items of safety concern listed above.  Theresa Mckay, Theresa Mckay 06/23/2013, 10:33 AM

## 2013-06-23 NOTE — Progress Notes (Signed)
The focus of this group is to educate the patient on the purpose and policies of crisis stabilization and provide a format to answer questions about their admission.  The group details unit policies and expectations of patients while admitted.  Patient attended 0900 nurse education orientation group this morning.  Patient actively participated, appropriate affect, alert, appropriate insight and engagement.  Today patient will work on 3 goals for discharge.  
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
## 2013-06-24 MED ORDER — CLONAZEPAM 1 MG PO TABS: 1.0000 mg | ORAL_TABLET | Freq: Four times a day (QID) | ORAL | Status: DC | PRN

## 2013-06-24 MED ORDER — NICOTINE 21 MG/24HR TD PT24: 21.0000 mg | MEDICATED_PATCH | Freq: Every day | TRANSDERMAL | Status: DC

## 2013-06-24 MED ORDER — LAMOTRIGINE 200 MG PO TABS: 200.0000 mg | ORAL_TABLET | Freq: Every day | ORAL | Status: DC

## 2013-06-24 MED ORDER — LORATADINE 10 MG PO TABS: 10.0000 mg | ORAL_TABLET | Freq: Every day | ORAL | Status: DC

## 2013-06-24 MED ORDER — DOXEPIN HCL 50 MG PO CAPS: 50.0000 mg | ORAL_CAPSULE | Freq: Every day | ORAL | Status: DC

## 2013-06-24 MED ORDER — BUSPIRONE HCL 15 MG PO TABS: 15.0000 mg | ORAL_TABLET | Freq: Three times a day (TID) | ORAL | Status: DC

## 2013-06-24 NOTE — Progress Notes (Signed)
Pt d/c from hospital with her family. All items returned. D/C instructions given and prescriptions given. Pt denies si and hi.

## 2013-06-24 NOTE — Progress Notes (Signed)
[facility] :  Will you be returning to the same living situation after discharge: Yes,  Patient returning to mother's home. At discharge, do you have transportation home?:Yes,  Patient to arrange transportation home. Do you have the ability to pay for your medications:Yes,  Patient has Medicaid.  Release of information consent forms completed and in the chart;  Patient's signature needed at discharge.  Patient to Follow up at: Follow-up Information   Follow up with [facility] On 06/29/2013. ([facility])    Contact information:   [address] [place], [place]   [address]  [phone]      Follow up with Dr. Darla [facility]Kimberly Johnson [facility] On 07/02/2013. (Thursday, July 02, 2013 at 9:50 AM)    Contact information:   [address] [place], [place]   [address]   [phone]      Follow up with Please contact [facility] at [phone] to determine if they will start a DBT group over the summer.   Contact information:   No information to be released to [facility] as no appointment or information released to them.      Patient denies SI/HI:   Patient no longer endorsing SI/HI or other thoughts of self harm.   Safety Planning and Suicide Prevention discussed: .Reviewed with all patients during discharge planning group   Theresa Mckay, Theresa Mckay 06/24/2013, 10:53 AM
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
## 2013-06-24 NOTE — Tx Team (Signed)
Interdisciplinary Treatment Plan Update   Date Reviewed:  06/24/2013  Time Reviewed:  10:59 AM  Progress in Treatment:   Attending groups: Yes Participating in groups: Yes Taking medication as prescribed: Yes  Tolerating medication: Yes Family/Significant other contact made:  No, but will ask patient for consent for collateral contact Patient understands diagnosis: Yes  Discussing patient identified problems/goals with staff: Yes Medical problems stabilized or resolved: Yes Denies suicidal/homicidal ideation: Yes Patient has not harmed self or others: Yes  For review of initial/current patient goals, please see plan of care.  Estimated Length of Stay: Discharge today  Reasons for Continued Hospitalization:   New Problems/Goals identified:    Discharge Plan or Barriers:   [place] [facility]  Additional Comments:   Patient signed 72 hour request for discharge which will be up on Wednesday 3/11 at 14:55  Attendees:  Patient:  Theresa Mckay 06/24/2013 10:59 AM   Signature: Mervyn GayJ. Jonnalagadda, MD 06/24/2013 10:59 AM  Signature:  Claudette Headonrad Withrow, FNP 06/24/2013 10:59 AM  Signature:    Robbie LouisVivian Kent, RN 06/24/2013 10:59 AM  Signature:  Leighton ParodyBritney Tyson, RN 06/24/2013 10:59 AM  Signature:  06/24/2013 10:59 AM  Signature:  Juline PatchQuylle Zeanna Sunde, LCSW 06/24/2013 10:59 AM  Signature:  Reyes Ivanhelsea Horton, LCSW 06/24/2013 10:59 AM  Signature:  Leisa LenzValerie Enoch, Care Coordinator [facility] 06/24/2013 10:59 AM  Signature:  06/24/2013 10:59 AM  Signature:   06/24/2013  10:59 AM  Signature:   06/24/2013  10:59 AM  Signature:  06/24/2013  10:59 AM    Scribe for Treatment Team:   Juline PatchQuylle Esperanza Madrazo,  06/24/2013 10:59 AM

## 2013-06-24 NOTE — [facility] Group Notes (Signed)
[facility] Note   06/24/2013 11:04 AM    Participation Quality:  Appropraite  Mood/Affect:  Appropriate  Depression Rating:  1  Anxiety Rating:  4  Thoughts of Suicide:  No  Will you contract for safety?   NA  Current AVH:  No  Plan for Discharge/Comments:  Patient attended discharge planning group and actively participated in group.  She will follow up with [place]. CSW provided all participants with daily workbook.   Transportation Means: Patient has transportation.   Supports:  Patient has a support system.   Jazlene Bares, Joesph JulyQuylle Hairston

## 2013-06-24 NOTE — BHH Group Notes (Signed)
[facility] LCSW Group Therapy  Emotional Regulation 1:15 - 2: 30 PM        06/24/2013     Type of Therapy:  Group Therapy  Participation Level:  Appropriate  Participation Quality:  Appropriate  Affect:  Appropriate  Cognitive:  Attentive Appropriate  Insight:  Developing/Improving Engaged  Engagement in Therapy:  Developing/Improving Engaged  Modes of Intervention:  Discussion Exploration Problem-Solving Supportive  Summary of Progress/Problems:  Group topic was emotional regulations.  Patient participated in the discussion and was able to identify an emotion that needed to regulated. Patient shared the emotion she has to control is anger from past hurts.  Patient was able to identify approprite coping skills.  Wynn BankerHodnett, Katelyne Galster Hairston 06/24/2013
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
## 2013-06-29 NOTE — Progress Notes (Signed)
Patient Discharge Instructions:  After Visit Summary (AVS):   Faxed to:  06/29/13 Discharge Summary Note:   Faxed to:  06/29/13 Psychiatric Admission Assessment Note:   Faxed to:  06/29/13 Suicide Risk Assessment - Discharge Assessment:   Faxed to:  06/29/13 Faxed/Sent to the Next Level Care provider:  06/29/13 Faxed to [facility] [place] @ [phone] Faxed to [facility] @ [phone]  Jerelene ReddenSheena E Milton, 06/29/2013, 2:59 PM

## 2016-05-26 ENCOUNTER — Encounter (HOSPITAL_COMMUNITY): Payer: Self-pay | Admitting: *Deleted

## 2016-05-26 ENCOUNTER — Ambulatory Visit (INDEPENDENT_AMBULATORY_CARE_PROVIDER_SITE_OTHER): Payer: Medicaid Other

## 2016-05-26 ENCOUNTER — Ambulatory Visit (HOSPITAL_COMMUNITY)
Admission: EM | Admit: 2016-05-26 | Discharge: 2016-05-26 | Disposition: A | Discharge status: Home / Self Care | Payer: Medicaid Other | Attending: Internal Medicine | Admitting: Internal Medicine

## 2016-05-26 DIAGNOSIS — M25551 Pain in right hip: Secondary | ICD-10-CM

## 2016-05-26 DIAGNOSIS — R52 Pain, unspecified: Secondary | ICD-10-CM

## 2016-05-26 HISTORY — DX: Pain in unspecified hip: M25.559

## 2016-05-26 MED ORDER — PREDNISONE 10 MG PO TABS: 20.0000 mg | ORAL_TABLET | Freq: Every day | ORAL | 0 refills | Status: AC

## 2016-05-26 MED ORDER — HYDROCODONE-ACETAMINOPHEN 5-325 MG PO TABS: 2.0000 | ORAL_TABLET | Freq: Four times a day (QID) | ORAL | 0 refills | Status: DC | PRN

## 2016-05-26 NOTE — ED Triage Notes (Signed)
Pt has hx right hip pain and is supposed to have surgery.  Tonight kicked ball to child when she felt right hip "lock up" then "crunch" when she went to sit down.  C/O continued pain.  Has taken IBU.

## 2016-05-26 NOTE — ED Provider Notes (Signed)
CSN: [id number]     Arrival date & time 05/26/16  1733 History   First MD Initiated Contact with Patient 05/26/16 1945     Chief Complaint  Patient presents with  . 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 Patient is pending to see a orthopedic surgeon and has a plan to get total hip replacement in near future. Patient reports that her daughter was playing soccer outside today and the daughter kicked soccer ball toward her, and patient without thinking kicked the ball back to the daughter. After doing so, patient felt like her right hip "locked up" and "crunch" when she went back to sit down. Pain is currently 10/10. She is currently taking ibuprofen for her pain. Patient brought her past Xray result in today dated in 02/2016 which showed advanced degenerative arthritis of right hip.       Past Medical History:  Diagnosis Date  . Bipolar 1 disorder (HCC)   . Depression   . Hip pain   . PTSD (post-traumatic stress disorder)    History reviewed. No pertinent surgical history. No family history on file. Social History  Substance Use Topics  . Smoking status: Current Every Day Smoker  . Smokeless tobacco: Never Used  . Alcohol use No   OB History    No data available     Review of Systems  Constitutional:       As stated in the history of present illness.    Allergies  Augmentin [amoxicillin-pot clavulanate]; Latex; Zyprexa [olanzapine]; and Tramadol  Home Medications   Prior to Admission medications   Medication Sig Start Date End Date Taking? Authorizing Provider  busPIRone (BUSPAR) 15 MG tablet Take 1 tablet (15 mg total) by mouth 3 (three) times daily. 06/24/13  Yes Beau FannyJohn C Withrow, FNP  gabapentin (NEURONTIN) 300 MG capsule Take  300 mg by mouth 2 (two) times daily.   Yes Historical Provider, MD  IBUPROFEN PO Take by mouth.   Yes Historical Provider, MD  lamoTRIgine (LAMICTAL) 200 MG tablet Take 1 tablet (200 mg total) by mouth daily. Patient taking differently: Take 300 mg by mouth daily.  06/24/13  Yes Beau FannyJohn C Withrow, FNP  B Complex-C (B-COMPLEX WITH VITAMIN C) tablet Take 1 tablet by mouth daily.    Historical Provider, MD  BIOTIN PO Take 1 tablet by mouth daily.    Historical Provider, MD  clonazePAM (KLONOPIN) 1 MG tablet Take 1 tablet (1 mg total) by mouth 4 (four) times daily as needed (anxiety). 06/24/13   Beau FannyJohn C Withrow, FNP  Cyanocobalamin (VITAMIN B-12 PO) Take 1 tablet by mouth daily.    Historical Provider, MD  doxepin (SINEQUAN) 50 MG capsule Take 1 capsule (50 mg total) by mouth at bedtime. 06/24/13   Beau FannyJohn C Withrow, FNP  loratadine (CLARITIN) 10 MG tablet Take 1 tablet (10 mg total) by mouth daily. 06/24/13   Everardo AllJohn C Withrow, FNP  MAGNESIUM PO Take 1 tablet by mouth daily.    Historical Provider, MD  nicotine (NICODERM CQ - DOSED IN MG/24 HOURS) 21 mg/24hr patch Place 1 patch (21 mg total) onto the skin daily. 06/24/13   Beau FannyJohn C Withrow, FNP  Prenatal Vit-Fe Fumarate-FA (PRENATAL MULTIVITAMIN) TABS tablet Take 1 tablet by mouth daily at 12 noon.    Historical Provider, MD   Meds Ordered  and Administered this Visit  Medications - No data to display  BP 132/81   Pulse 89   Temp 99 F (37.2 C) (Oral)   Resp 16   LMP 05/08/2016 (Approximate)   SpO2 100%  No data found.   Physical Exam  Constitutional: She is oriented to person, place, and time. She appears well-developed and well-nourished.  Cardiovascular: Normal rate, regular rhythm and normal heart sounds.   Pulmonary/Chest: Effort normal and breath sounds normal. No respiratory distress. She has no wheezes.  Musculoskeletal:  Patient able to ambulate but is limping. Patient has severe pain over the right hip on range of motion.   Neurological: She is  alert and oriented to person, place, and time.  Nursing note and vitals reviewed.   Urgent Care Course     Procedures (including critical care time)  Labs Review Labs Reviewed - No data to display  Imaging Review Dg Hip Unilat With Pelvis 2-3 Views Right  Result Date: 05/26/2016 CLINICAL DATA:  Chronic pain in the right hip EXAM: DG HIP (WITH OR WITHOUT PELVIS) 2-3V RIGHT COMPARISON:  None. FINDINGS: Pubic symphysis appears intact. The SI joints are symmetric. There is no fracture or dislocation. There is mixed lucency and sclerosis in the right femoral head suspicious for AVN. IMPRESSION: 1. No acute fracture or malalignment 2. 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 Electronically Signed   By: Jasmine Pang M.D.   On: 05/26/2016 20:04     MDM   1. Right hip pain   2. 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 Patient informed of this finding. Informed to see the orthopedic specialist as scheduled. All questions were answered.     Lucia Estelle, NP 05/26/16 2022    Lucia Estelle, NP 05/26/16 2022

## 2016-05-31 ENCOUNTER — Encounter (HOSPITAL_COMMUNITY): Payer: Self-pay

## 2016-05-31 ENCOUNTER — Emergency Department (HOSPITAL_COMMUNITY)
Admission: EM | Admit: 2016-05-31 | Discharge: 2016-05-31 | Disposition: A | Discharge status: Home / Self Care | Payer: Medicaid Other | Attending: Emergency Medicine | Admitting: Emergency Medicine

## 2016-05-31 DIAGNOSIS — Z79899 Other long term (current) drug therapy: Secondary | ICD-10-CM | POA: Diagnosis not present

## 2016-05-31 DIAGNOSIS — F17200 Nicotine dependence, unspecified, uncomplicated: Secondary | ICD-10-CM | POA: Insufficient documentation

## 2016-05-31 DIAGNOSIS — Z91040 Latex allergy status: Secondary | ICD-10-CM | POA: Insufficient documentation

## 2016-05-31 DIAGNOSIS — M25551 Pain in right hip: Secondary | ICD-10-CM

## 2016-05-31 LAB — COMPREHENSIVE METABOLIC PANEL
ALK PHOS: 53 U/L (ref 38–126)
ALT: 20 U/L (ref 14–54)
ANION GAP: 12 (ref 5–15)
AST: 22 U/L (ref 15–41)
Albumin: 4.2 g/dL (ref 3.5–5.0)
BILIRUBIN TOTAL: 0.4 mg/dL (ref 0.3–1.2)
BUN: 11 mg/dL (ref 6–20)
CALCIUM: 9.2 mg/dL (ref 8.9–10.3)
CO2: 23 mmol/L (ref 22–32)
CREATININE: 0.72 mg/dL (ref 0.44–1.00)
Chloride: 102 mmol/L (ref 101–111)
GFR calc Af Amer: >60 mL/min (ref >60)
Glucose, Bld: 131 mg/dL — ABNORMAL HIGH (ref 65–99)
Potassium: 3.9 mmol/L (ref 3.5–5.1)
SODIUM: 137 mmol/L (ref 135–145)
TOTAL PROTEIN: 6.6 g/dL (ref 6.5–8.1)

## 2016-05-31 LAB — CBC WITH DIFFERENTIAL/PLATELET
Basophils Absolute: 0 10*3/uL (ref 0.0–0.1)
Basophils Relative: 0 %
Eosinophils Absolute: 0.2 10*3/uL (ref 0.0–0.7)
Eosinophils Relative: 2 %
HEMATOCRIT: 41.3 % (ref 36.0–46.0)
Hemoglobin: 13.5 g/dL (ref 12.0–15.0)
LYMPHS PCT: 29 %
Lymphs Abs: 2.6 10*3/uL (ref 0.7–4.0)
MCH: 30.8 pg (ref 26.0–34.0)
MCHC: 32.7 g/dL (ref 30.0–36.0)
MCV: 94.1 fL (ref 78.0–100.0)
Monocytes Absolute: 0.7 10*3/uL (ref 0.1–1.0)
Monocytes Relative: 7 %
NEUTROS ABS: 5.5 10*3/uL (ref 1.7–7.7)
Neutrophils Relative %: 62 %
Platelets: 280 10*3/uL (ref 150–400)
RBC: 4.39 MIL/uL (ref 3.87–5.11)
RDW: 12.8 % (ref 11.5–15.5)
WBC: 9 10*3/uL (ref 4.0–10.5)

## 2016-05-31 LAB — C-REACTIVE PROTEIN: CRP: 2.1 mg/dL — ABNORMAL HIGH (ref <1.0)

## 2016-05-31 LAB — SEDIMENTATION RATE: Sed Rate: 2 mm/hr (ref 0–22)

## 2016-05-31 MED ORDER — IBUPROFEN 600 MG PO TABS: 600.0000 mg | ORAL_TABLET | Freq: Four times a day (QID) | ORAL | 0 refills | Status: DC | PRN

## 2016-05-31 MED ORDER — HYDROCODONE-ACETAMINOPHEN 5-325 MG PO TABS: 1.0000 | ORAL_TABLET | Freq: Four times a day (QID) | ORAL | 0 refills | Status: DC | PRN

## 2016-05-31 NOTE — ED Triage Notes (Signed)
Pt presents with 1 week h/o R hip pain.  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

## 2016-05-31 NOTE — ED Notes (Signed)
Pt demonstrated appropriate use of crutches. 

## 2016-05-31 NOTE — Discharge Instructions (Signed)
Take ibuprofen 600mg  every 6 hours. Take 1-2 Norco every 4-6 hours as needed for severe pain. Use crutches to ambulate carefully.Please follow-up with the orthopedic doctor as soon as possible for further evaluation and treatment of your hip pain. If you develop any fever, chills, sweats, worsening pain, please return to the emergency department immediately.

## 2016-05-31 NOTE — Progress Notes (Signed)
Orthopedic Tech Progress Note Patient Details:  Theresa Mckay 03/05/1981 [id number]  Ortho Devices Type of Ortho Device: Crutches Ortho Device/Splint Interventions: Ordered, Adjustment   [name], Demere Dotzler Craig 05/31/2016, 7:18 PM

## 2016-05-31 NOTE — ED Notes (Signed)
ED Provider at bedside. 

## 2016-05-31 NOTE — ED Provider Notes (Signed)
[facility]-EMERGENCY DEPT Provider Note   CSN: [id number] Arrival date & time: 05/31/16  1200  By signing my name below, I, Teofilo Pod, attest that this documentation has been prepared under the direction and in the presence of Buel Ream, PA-C. Electronically Signed: Teofilo Pod, ED Scribe. 05/31/2016. 4:27 PM.    History   Chief Complaint No chief complaint on file.   The history is provided by the patient. No language interpreter was used.   HPI Comments:  Theresa Mckay is a 36 y.o. female who presents to the Emergency Department complaining of constant right hip pain x 1 week. Pt describes the pain as burning. She was seen at [facility] yesterday and was referred to the ED. Pt states that she has also been having worsening pain in her left hip because she has been favoring her left side when walking. She also reports associated R low back pain, and states that she had a fever of 102 last week but it has since resolved. Pt states that she was scheduled to have a hip replacement in [place] but she moved to [place] 2 months ago so was unable to have surgery. Pt states that she has had hip problems for ~9 years. No alleviating factors noted. Pt denies hx of hip infection. Pt denies fever, cough, congestion.   Past Medical History:  Diagnosis Date  . Bipolar 1 disorder (HCC)   . Depression   . Hip pain   . PTSD (post-traumatic stress disorder)     Patient Active Problem List   Diagnosis Date Noted  . Chronic posttraumatic stress disorder 06/19/2013  . MDD (major depressive disorder) 06/18/2013  . MDD (major depressive disorder), recurrent episode, severe (HCC) 06/17/2013  . Homicidal ideation 06/17/2013    History reviewed. No pertinent surgical history.  OB History    No data available       Home Medications    Prior to Admission medications   Medication Sig Start Date End Date Taking? Authorizing Provider  B Complex-C (B-COMPLEX WITH VITAMIN C) tablet Take  1 tablet by mouth daily.    Historical Provider, MD  BIOTIN PO Take 1 tablet by mouth daily.    Historical Provider, MD  busPIRone (BUSPAR) 15 MG tablet Take 1 tablet (15 mg total) by mouth 3 (three) times daily. 06/24/13   Beau Fanny, FNP  clonazePAM (KLONOPIN) 1 MG tablet Take 1 tablet (1 mg total) by mouth 4 (four) times daily as needed (anxiety). 06/24/13   Beau Fanny, FNP  Cyanocobalamin (VITAMIN B-12 PO) Take 1 tablet by mouth daily.    Historical Provider, MD  doxepin (SINEQUAN) 50 MG capsule Take 1 capsule (50 mg total) by mouth at bedtime. 06/24/13   Beau Fanny, FNP  gabapentin (NEURONTIN) 300 MG capsule Take 300 mg by mouth 2 (two) times daily.    Historical Provider, MD  HYDROcodone-acetaminophen (NORCO/VICODIN) 5-325 MG tablet Take 1-2 tablets by mouth every 6 (six) hours as needed for severe pain. 05/31/16   Emi Holes, PA-C  ibuprofen (ADVIL,MOTRIN) 600 MG tablet Take 1 tablet (600 mg total) by mouth every 6 (six) hours as needed. 05/31/16   Emi Holes, PA-C  lamoTRIgine (LAMICTAL) 200 MG tablet Take 1 tablet (200 mg total) by mouth daily. Patient taking differently: Take 300 mg by mouth daily.  06/24/13   Beau Fanny, FNP  loratadine (CLARITIN) 10 MG tablet Take 1 tablet (10 mg total) by mouth daily. 06/24/13   Everardo All  Withrow, FNP  MAGNESIUM PO Take 1 tablet by mouth daily.    Historical Provider, MD  nicotine (NICODERM CQ - DOSED IN MG/24 HOURS) 21 mg/24hr patch Place 1 patch (21 mg total) onto the skin daily. 06/24/13   Beau Fanny, FNP  predniSONE (DELTASONE) 10 MG tablet Take 2 tablets (20 mg total) by mouth daily. Take 60 mg on Day 1-2, 40 mg on day 3-4, 20 mg on day 5-6 05/26/16 06/01/16  Lucia Estelle, NP  Prenatal Vit-Fe Fumarate-FA (PRENATAL MULTIVITAMIN) TABS tablet Take 1 tablet by mouth daily at 12 noon.    Historical Provider, MD    Family History History reviewed. No pertinent family history.  Social History Social History  Substance Use Topics  .  Smoking status: Current Every Day Smoker  . Smokeless tobacco: Never Used  . Alcohol use No     Allergies   Augmentin [amoxicillin-pot clavulanate]; Latex; Zyprexa [olanzapine]; and Tramadol   Review of Systems Review of Systems  Constitutional: Negative for chills and fever.  HENT: Negative for congestion, facial swelling and sore throat.   Respiratory: Negative for cough and shortness of breath.   Cardiovascular: Negative for chest pain.  Gastrointestinal: Negative for abdominal pain, nausea and vomiting.  Genitourinary: Negative for dysuria.  Musculoskeletal: Positive for arthralgias and back pain.  Skin: Negative for rash and wound.  Neurological: Negative for headaches.  Psychiatric/Behavioral: The patient is not nervous/anxious.      Physical Exam Updated Vital Signs BP 118/77 (BP Location: Right Arm)   Pulse 83   Temp 98.6 F (37 C) (Oral)   Resp 18   LMP 05/08/2016 (Approximate)   SpO2 100%   Physical Exam  Constitutional: She is oriented to person, place, and time. She appears well-developed and well-nourished. No distress.  HENT:  Head: Normocephalic and atraumatic.  Mouth/Throat: Oropharynx is clear and moist. No oropharyngeal exudate.  Eyes: Conjunctivae are normal. Pupils are equal, round, and reactive to light. Right eye exhibits no discharge. Left eye exhibits no discharge. No scleral icterus.  Neck: Normal range of motion. Neck supple. No thyromegaly present.  Cardiovascular: Normal rate, regular rhythm, normal heart sounds and intact distal pulses.  Exam reveals no gallop and no friction rub.   No murmur heard. Pulmonary/Chest: Effort normal and breath sounds normal. No stridor. No respiratory distress. She has no wheezes. She has no rales.  Abdominal: Soft. Bowel sounds are normal. She exhibits no distension. There is no tenderness. There is no rebound and no guarding.  Musculoskeletal: She exhibits no edema.  Right hip: Anterior lateral and posterior  hip tenderness. FROM. 5/5 strength to bilateral lower extremities normal sensation DP pulses intact.   Lymphadenopathy:    She has no cervical adenopathy.  Neurological: She is alert and oriented to person, place, and time. Coordination normal.  Skin: Skin is warm and dry. No rash noted. She is not diaphoretic. No pallor.  Psychiatric: She has a normal mood and affect. Judgment normal.  Nursing note and vitals reviewed.    ED Treatments / Results  DIAGNOSTIC STUDIES:  Oxygen Saturation is 100% on RA, normal by my interpretation.    COORDINATION OF CARE:  4:27 PM Discussed treatment plan with pt at bedside and pt agreed to plan.   Labs (all labs ordered are listed, but only abnormal results are displayed) Labs Reviewed  COMPREHENSIVE METABOLIC PANEL - Abnormal; Notable for the following:       Result Value   Glucose, Bld 131 (*)  All other components within normal limits  C-REACTIVE PROTEIN - Abnormal; Notable for the following:    CRP 2.1 (*)    All other components within normal limits  CBC WITH DIFFERENTIAL/PLATELET  SEDIMENTATION RATE    EKG  EKG Interpretation None       Radiology No results found.  Procedures Procedures (including critical care time)  Medications Ordered in ED Medications - No data to display   Initial Impression / Assessment and Plan / ED Course  I have reviewed the triage vital signs and the nursing notes.  Pertinent labs & imaging results that were available during my care of the patient were reviewed by me and considered in my medical decision making (see chart for details).     CBC unremarkable. CMP shows glucose 131. 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 Patient with full range of motion, afebrile, and labs WNL. Doubt septic joint. Will discharge home with crutches with follow-up to orthopedics.Patient given ibuprofen and short course of Norco. I reviewed [place] narcotic database and found no  discrepancies.Strict return precautions given. Patient vitals stable throughout ED course and discharged in satisfactory condition. Patient also evaluated by Dr. Rhunette CroftNanavati who guided the patient's management and agrees with plan.  Final Clinical Impressions(s) / ED Diagnoses   Final diagnoses:  Right hip pain    New Prescriptions Discharge Medication List as of 05/31/2016  6:52 PM    I personally performed the services described in this documentation, which was scribed in my presence. The recorded information has been reviewed and is accurate.     Emi Holeslexandra M Nadirah Socorro, PA-C 05/31/16 [id number]    Derwood KaplanAnkit Nanavati, MD 06/02/16 [id number]

## 2016-07-24 ENCOUNTER — Other Ambulatory Visit: Payer: Self-pay | Admitting: Orthopedic Surgery

## 2016-07-27 ENCOUNTER — Encounter (HOSPITAL_COMMUNITY): Payer: Self-pay

## 2016-07-27 ENCOUNTER — Encounter (HOSPITAL_COMMUNITY)
Admission: RE | Admit: 2016-07-27 | Discharge: 2016-07-27 | Disposition: A | Discharge status: Home / Self Care | Payer: Medicaid Other | Source: Ambulatory Visit | Attending: Orthopedic Surgery | Admitting: Orthopedic Surgery

## 2016-07-27 DIAGNOSIS — Z01812 Encounter for preprocedural laboratory examination: Secondary | ICD-10-CM | POA: Diagnosis not present

## 2016-07-27 HISTORY — DX: Pneumonia, unspecified organism: J18.9

## 2016-07-27 HISTORY — DX: Unspecified osteoarthritis, unspecified site: M19.90

## 2016-07-27 HISTORY — DX: Unspecified convulsions: R56.9

## 2016-07-27 HISTORY — DX: Anxiety disorder, unspecified: F41.9

## 2016-07-27 LAB — PROTIME-INR
INR: 1
PROTHROMBIN TIME: 13.2 s (ref 11.4–15.2)

## 2016-07-27 LAB — BASIC METABOLIC PANEL
ANION GAP: 8 (ref 5–15)
BUN: 7 mg/dL (ref 6–20)
CHLORIDE: 106 mmol/L (ref 101–111)
CO2: 25 mmol/L (ref 22–32)
Calcium: 9.1 mg/dL (ref 8.9–10.3)
Creatinine, Ser: 0.7 mg/dL (ref 0.44–1.00)
Glucose, Bld: 102 mg/dL — ABNORMAL HIGH (ref 65–99)
POTASSIUM: 4.2 mmol/L (ref 3.5–5.1)
SODIUM: 139 mmol/L (ref 135–145)

## 2016-07-27 LAB — URINALYSIS, ROUTINE W REFLEX MICROSCOPIC
BILIRUBIN URINE: NEGATIVE
Glucose, UA: NEGATIVE mg/dL
HGB URINE DIPSTICK: NEGATIVE
Ketones, ur: NEGATIVE mg/dL
Leukocytes, UA: NEGATIVE
Nitrite: NEGATIVE
PH: 5 (ref 5.0–8.0)
Protein, ur: NEGATIVE mg/dL
SPECIFIC GRAVITY, URINE: 1.008 (ref 1.005–1.030)

## 2016-07-27 LAB — CBC WITH DIFFERENTIAL/PLATELET
BASOS ABS: 0 10*3/uL (ref 0.0–0.1)
Basophils Relative: 1 %
EOS PCT: 2 %
Eosinophils Absolute: 0.2 10*3/uL (ref 0.0–0.7)
HCT: 38.1 % (ref 36.0–46.0)
HEMOGLOBIN: 12.8 g/dL (ref 12.0–15.0)
LYMPHS PCT: 34 %
Lymphs Abs: 2.3 10*3/uL (ref 0.7–4.0)
MCH: 31.2 pg (ref 26.0–34.0)
MCHC: 33.6 g/dL (ref 30.0–36.0)
MCV: 92.9 fL (ref 78.0–100.0)
Monocytes Absolute: 0.5 10*3/uL (ref 0.1–1.0)
Monocytes Relative: 8 %
NEUTROS PCT: 55 %
Neutro Abs: 3.7 10*3/uL (ref 1.7–7.7)
PLATELETS: 256 10*3/uL (ref 150–400)
RBC: 4.1 MIL/uL (ref 3.87–5.11)
RDW: 13 % (ref 11.5–15.5)
WBC: 6.7 10*3/uL (ref 4.0–10.5)

## 2016-07-27 LAB — TYPE AND SCREEN
ABO/RH(D): A NEG
ANTIBODY SCREEN: NEGATIVE

## 2016-07-27 LAB — HCG, SERUM, QUALITATIVE: Preg, Serum: NEGATIVE

## 2016-07-27 LAB — SURGICAL PCR SCREEN
MRSA, PCR: NEGATIVE
Staphylococcus aureus: NEGATIVE

## 2016-07-27 LAB — ABO/RH: ABO/RH(D): A NEG

## 2016-07-27 LAB — APTT: APTT: 30 s (ref 24–36)

## 2016-07-27 NOTE — Progress Notes (Addendum)
PCP is Teres, Np at [facility]. Denies ever seeing a cardiologist. Denies ever having a card cath, stress test, or echo. Denies any chest pain. Denies any fever.

## 2016-07-27 NOTE — Pre-Procedure Instructions (Signed)
Theresa Mckay  07/27/2016     Your procedure is scheduled on Monday, April 23.   Report to [facility] Admitting at 5:30 AM                 Your surgery or procedure is scheduled for 7:30 AM   Call this number if you have problems the morning of surgery: [phone]                 For any other questions, please call [phone], Monday - Friday 8 AM - 4 PM.   Remember:  Do not eat food or drink liquids after midnight Sunday, April 22.   Take these medicines the morning of surgery with A SIP OF WATER: busPIRone (BUSPAR), diphenhydrAMINE (BENADRYL), lamoTRIgine (LAMICTAL).               1 Week prior to surgery STOP taking Aspirin, Aspirin Products (Goody Powder, Excedrin Migraine), Ibuprofen (Advil), Naproxen (Aleve), Vitamins and Herbal Products (ie Fish Oil)                   Special instructions:   [facility]- Preparing For Surgery  Before surgery, you can play an important role. Because skin is not sterile, your skin needs to be as free of germs as possible. You can reduce the number of germs on your skin by washing with CHG (chlorahexidine gluconate) Soap before surgery.  CHG is an antiseptic cleaner which kills germs and bonds with the skin to continue killing germs even after washing.  Please do not use if you have an allergy to CHG or antibacterial soaps. If your skin becomes reddened/irritated stop using the CHG.  Do not shave (including legs and underarms) for at least 48 hours prior to first CHG shower. It is OK to shave your face.  Please follow these instructions carefully.   1. Shower the NIGHT BEFORE SURGERY and the MORNING OF SURGERY with CHG.   2. If you chose to wash your hair, wash your hair first as usual with your normal shampoo.  3. After you shampoo, rinse your hair and body thoroughly to remove the shampoo.  4. Use CHG as you would any other liquid soap. You can apply CHG directly to the skin and wash gently with a scrungie or a clean  washcloth.   5. Apply the CHG Soap to your body ONLY FROM THE NECK DOWN.  Do not use on open wounds or open sores. Avoid contact with your eyes, ears, mouth and genitals (private parts). Wash genitals (private parts) with your normal soap.  6. Wash thoroughly, paying special attention to the area where your surgery will be performed.  7. Thoroughly rinse your body with warm water from the neck down.  8. DO NOT shower/wash with your normal soap after using and rinsing off the CHG Soap.  9. Pat yourself dry with a CLEAN TOWEL.   10. Wear CLEAN PAJAMAS   11. Place CLEAN SHEETS on your bed the night of your first shower and DO NOT SLEEP WITH PETS.  Day of Surgery: Do not apply any deodorants/lotionspowders, or perfumes, . Please wear clean clothes to the hospital/surgery center.    Do not wear jewelry, make-up or nail polish.  Do not shave 48 hours prior to surgery.  Men may shave face and neck.  Do not bring valuables to the hospital.  [facility] is not responsible for any belongings or valuables.  Contacts, dentures or bridgework may  not be worn into surgery.  Leave your suitcase in the car.  After surgery it may be brought to your room.  For patients admitted to the hospital, discharge time will be determined by your treatment team.  Patients discharged the day of surgery will not be allowed to drive home.   Please read over the following fact sheets that you were given: [facility]- Preparing For Surgery and Patient Instructions for Mupirocin Application, Incentive Spirometry, Pain Booklet, Surgical Site Infections

## 2016-08-01 DIAGNOSIS — M87051 Idiopathic aseptic necrosis of right femur: Secondary | ICD-10-CM | POA: Diagnosis present

## 2016-08-01 NOTE — H&P (Signed)
TOTAL HIP ADMISSION H&P  Patient is admitted for right total hip arthroplasty.  Subjective:  Chief Complaint: right hip pain  HPI: Theresa Mckay, 36 y.o. female, has a history of pain and functional disability in the right hip(s) due to arthritis and AVN and patient has failed non-surgical conservative treatments for greater than 12 weeks to include NSAID's and/or analgesics, corticosteriod injections, flexibility and strengthening excercises, weight reduction as appropriate and activity modification.  Onset of symptoms was gradual starting several years ago with gradually worsening course since that time.The patient noted no past surgery on the right hip(s).  Patient currently rates pain in the right hip at 10 out of 10 with activity. Patient has night pain, worsening of pain with activity and weight bearing, trendelenberg gait, pain that interfers with activities of daily living and pain with passive range of motion. Patient has evidence of joint space narrowing and AVN with head collapse by imaging studies. This condition presents safety issues increasing the risk of falls.    There is no current active infection.  Patient Active Problem List   Diagnosis Date Noted  . Chronic posttraumatic stress disorder 06/19/2013  . MDD (major depressive disorder) 06/18/2013  . MDD (major depressive disorder), recurrent episode, severe (HCC) 06/17/2013  . Homicidal ideation 06/17/2013   Past Medical History:  Diagnosis Date  . Anxiety   . Arthritis   . Asthma    as a child  . Bipolar 1 disorder (HCC)   . Depression   . Headache   . Hip pain   . Pneumonia   . PTSD (post-traumatic stress disorder)   . Seizures (HCC)     Past Surgical History:  Procedure Laterality Date  . DILATION AND CURETTAGE OF UTERUS    . WISDOM TOOTH EXTRACTION      No prescriptions prior to admission.   Allergies  Allergen Reactions  . Augmentin [Amoxicillin-Pot Clavulanate]     Vomiting, diarrhea  . Latex    Hives, swelling  . Zyprexa [Olanzapine]     Becomes psychotic  . Tramadol     rash    Social History  Substance Use Topics  . Smoking status: Current Every Day Smoker    Packs/day: 1.00    Years: 20.00    Types: Cigarettes  . Smokeless tobacco: Never Used  . Alcohol use No    Family History  Problem Relation Age of Onset  . High blood pressure Mother   . Bipolar disorder Father   . Aneurysm Father   . Obesity Sister   . Breast cancer Sister   . Cervical cancer Sister   . Colon cancer Sister   . Bipolar disorder Sister   . Diabetes Other      Review of Systems  Constitutional: Positive for malaise/fatigue.  Eyes:       Poor vision  Musculoskeletal: Positive for joint pain and myalgias.  Neurological: Positive for seizures.  Endo/Heme/Allergies: Bruises/bleeds easily.  Psychiatric/Behavioral: The patient is nervous/anxious and has insomnia.     Objective:  Physical Exam  Constitutional: She is oriented to person, place, and time. She appears well-developed and well-nourished.  HENT:  Head: Normocephalic and atraumatic.  Eyes: Pupils are equal, round, and reactive to light.  Neck: Normal range of motion. Neck supple.  Cardiovascular: Intact distal pulses.   Respiratory: Effort normal.  Musculoskeletal: She exhibits tenderness.  Patient walks with a profound right-sided limp.  Any attempts at internal rotation.  The right hip causes severe pain left hip has a  full range of motion with minimal discomfort.  Foot tap is negative.  Skin is intact she is neurovascularly intact distally.    Neurological: She is alert and oriented to person, place, and time.  Skin: Skin is warm and dry.  Psychiatric: She has a normal mood and affect. Her behavior is normal. Judgment and thought content normal.    Vital signs in last 24 hours:    Labs:   Estimated body mass index is 35.77 kg/m as calculated from the following:   Height as of 07/27/16:  (1.676 m).   Weight as of  07/27/16: 100.5 kg (221 lb 9.6 oz).   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  There are also some subchondral cysts and sclerosis but no collapse.  Good maintenance of cartilage height.  Assessment/Plan:  End stage arthritis, right hip(s)  The patient history, physical examination, clinical judgement of the provider and imaging studies are consistent with end stage degenerative joint disease of the right hip(s) and total hip arthroplasty is deemed medically necessary. The treatment options including medical management, injection therapy, arthroscopy and arthroplasty were discussed at length. The risks and benefits of total hip arthroplasty were presented and reviewed. The risks due to aseptic loosening, infection, stiffness, dislocation/subluxation,  thromboembolic complications and other imponderables were discussed.  The patient acknowledged the explanation, agreed to proceed with the plan and consent was signed. Patient is being admitted for inpatient treatment for surgery, pain control, PT, OT, prophylactic antibiotics, VTE prophylaxis, progressive ambulation and ADL's and discharge planning.The patient is planning to be discharged home with home health services

## 2016-08-03 MED ORDER — TRANEXAMIC ACID 1000 MG/10ML IV SOLN
1000.0000 mg | INTRAVENOUS | Status: AC
  Administered 2016-08-06: 1000 mg via INTRAVENOUS
  Filled 2016-08-03: qty 10

## 2016-08-03 MED ORDER — SODIUM CHLORIDE 0.9 % IV SOLN
1500.0000 mg | INTRAVENOUS | Status: AC
  Administered 2016-08-06: 1500 mg via INTRAVENOUS
  Filled 2016-08-03: qty 1500

## 2016-08-03 MED ORDER — TRANEXAMIC ACID 1000 MG/10ML IV SOLN
2000.0000 mg | INTRAVENOUS | Status: AC
  Administered 2016-08-06: 2000 mg via TOPICAL
  Filled 2016-08-03: qty 20

## 2016-08-03 MED ORDER — BUPIVACAINE LIPOSOME 1.3 % IJ SUSP
20.0000 mL | Freq: Once | INTRAMUSCULAR | Status: AC
  Administered 2016-08-06: 20 mL
  Filled 2016-08-03: qty 20

## 2016-08-05 NOTE — Anesthesia Preprocedure Evaluation (Addendum)
Anesthesia Evaluation  Patient identified by MRN, date of birth, ID band Patient awake    Reviewed: Allergy & Precautions, H&P , NPO status , Patient's Chart, lab work & pertinent test results  Airway Mallampati: II  TM Distance: >3 FB Neck ROM: Full    Dental no notable dental hx. (+) Teeth Intact, Dental Advisory Given   Pulmonary asthma , Current Smoker,    Pulmonary exam normal breath sounds clear to auscultation       Cardiovascular Exercise Tolerance: Good negative cardio ROS   Rhythm:Regular Rate:Normal     Neuro/Psych  Headaches, Seizures -, Well Controlled,  Anxiety Depression Bipolar Disorder    GI/Hepatic negative GI ROS, Neg liver ROS,   Endo/Other  negative endocrine ROS  Renal/GU negative Renal ROS  negative genitourinary   Musculoskeletal  (+) Arthritis , Osteoarthritis,    Abdominal   Peds  Hematology negative hematology ROS (+)   Anesthesia Other Findings   Reproductive/Obstetrics negative OB ROS                            Anesthesia Physical Anesthesia Plan  ASA: II  Anesthesia Plan: Spinal and MAC   Post-op Pain Management:    Induction: Intravenous  Airway Management Planned: Simple Face Mask  Additional Equipment:   Intra-op Plan:   Post-operative Plan:   Informed Consent: I have reviewed the patients History and Physical, chart, labs and discussed the procedure including the risks, benefits and alternatives for the proposed anesthesia with the patient or authorized representative who has indicated his/her understanding and acceptance.   Dental advisory given  Plan Discussed with: CRNA  Anesthesia Plan Comments:         Anesthesia Quick Evaluation

## 2016-08-06 ENCOUNTER — Inpatient Hospital Stay (HOSPITAL_COMMUNITY): Payer: Medicaid Other | Admitting: Certified Registered Nurse Anesthetist

## 2016-08-06 ENCOUNTER — Inpatient Hospital Stay (HOSPITAL_COMMUNITY)
Admission: RE | Admit: 2016-08-06 | Discharge: 2016-08-08 | DRG: 470 | Disposition: A | Discharge status: Home Health | Payer: Medicaid Other | Source: Ambulatory Visit | Attending: Orthopedic Surgery | Admitting: Orthopedic Surgery

## 2016-08-06 ENCOUNTER — Encounter (HOSPITAL_COMMUNITY)
Admission: RE | Disposition: A | Discharge status: Home Health | Payer: Self-pay | Source: Ambulatory Visit | Attending: Orthopedic Surgery

## 2016-08-06 ENCOUNTER — Encounter (HOSPITAL_COMMUNITY): Payer: Self-pay | Admitting: Certified Registered Nurse Anesthetist

## 2016-08-06 ENCOUNTER — Inpatient Hospital Stay (HOSPITAL_COMMUNITY): Payer: Medicaid Other

## 2016-08-06 DIAGNOSIS — D62 Acute posthemorrhagic anemia: Secondary | ICD-10-CM | POA: Diagnosis not present

## 2016-08-06 DIAGNOSIS — Z881 Allergy status to other antibiotic agents status: Secondary | ICD-10-CM

## 2016-08-06 DIAGNOSIS — M549 Dorsalgia, unspecified: Secondary | ICD-10-CM | POA: Diagnosis present

## 2016-08-06 DIAGNOSIS — Z800 Family history of malignant neoplasm of digestive organs: Secondary | ICD-10-CM

## 2016-08-06 DIAGNOSIS — Z885 Allergy status to narcotic agent status: Secondary | ICD-10-CM | POA: Diagnosis not present

## 2016-08-06 DIAGNOSIS — X58XXXA Exposure to other specified factors, initial encounter: Secondary | ICD-10-CM | POA: Diagnosis not present

## 2016-08-06 DIAGNOSIS — Z8049 Family history of malignant neoplasm of other genital organs: Secondary | ICD-10-CM

## 2016-08-06 DIAGNOSIS — M87851 Other osteonecrosis, right femur: Secondary | ICD-10-CM | POA: Diagnosis present

## 2016-08-06 DIAGNOSIS — G8929 Other chronic pain: Secondary | ICD-10-CM | POA: Diagnosis present

## 2016-08-06 DIAGNOSIS — Q6589 Other specified congenital deformities of hip: Secondary | ICD-10-CM | POA: Diagnosis not present

## 2016-08-06 DIAGNOSIS — M1712 Unilateral primary osteoarthritis, left knee: Secondary | ICD-10-CM | POA: Diagnosis present

## 2016-08-06 DIAGNOSIS — Z888 Allergy status to other drugs, medicaments and biological substances status: Secondary | ICD-10-CM | POA: Diagnosis not present

## 2016-08-06 DIAGNOSIS — Z818 Family history of other mental and behavioral disorders: Secondary | ICD-10-CM

## 2016-08-06 DIAGNOSIS — F17210 Nicotine dependence, cigarettes, uncomplicated: Secondary | ICD-10-CM | POA: Diagnosis present

## 2016-08-06 DIAGNOSIS — M19041 Primary osteoarthritis, right hand: Secondary | ICD-10-CM | POA: Diagnosis present

## 2016-08-06 DIAGNOSIS — M1611 Unilateral primary osteoarthritis, right hip: Secondary | ICD-10-CM | POA: Diagnosis present

## 2016-08-06 DIAGNOSIS — Z7982 Long term (current) use of aspirin: Secondary | ICD-10-CM | POA: Diagnosis not present

## 2016-08-06 DIAGNOSIS — M19042 Primary osteoarthritis, left hand: Secondary | ICD-10-CM | POA: Diagnosis present

## 2016-08-06 DIAGNOSIS — Y92239 Unspecified place in hospital as the place of occurrence of the external cause: Secondary | ICD-10-CM | POA: Diagnosis not present

## 2016-08-06 DIAGNOSIS — R569 Unspecified convulsions: Secondary | ICD-10-CM | POA: Diagnosis present

## 2016-08-06 DIAGNOSIS — Z419 Encounter for procedure for purposes other than remedying health state, unspecified: Secondary | ICD-10-CM

## 2016-08-06 DIAGNOSIS — M87051 Idiopathic aseptic necrosis of right femur: Secondary | ICD-10-CM

## 2016-08-06 DIAGNOSIS — Z803 Family history of malignant neoplasm of breast: Secondary | ICD-10-CM | POA: Diagnosis not present

## 2016-08-06 DIAGNOSIS — Z91040 Latex allergy status: Secondary | ICD-10-CM

## 2016-08-06 DIAGNOSIS — F319 Bipolar disorder, unspecified: Secondary | ICD-10-CM | POA: Diagnosis present

## 2016-08-06 DIAGNOSIS — Z79899 Other long term (current) drug therapy: Secondary | ICD-10-CM | POA: Diagnosis not present

## 2016-08-06 DIAGNOSIS — S7001XA Contusion of right hip, initial encounter: Secondary | ICD-10-CM | POA: Diagnosis not present

## 2016-08-06 DIAGNOSIS — Z833 Family history of diabetes mellitus: Secondary | ICD-10-CM | POA: Diagnosis not present

## 2016-08-06 DIAGNOSIS — J45909 Unspecified asthma, uncomplicated: Secondary | ICD-10-CM | POA: Diagnosis present

## 2016-08-06 DIAGNOSIS — Z8701 Personal history of pneumonia (recurrent): Secondary | ICD-10-CM

## 2016-08-06 HISTORY — DX: Unspecified chronic bronchitis: J42

## 2016-08-06 HISTORY — DX: Idiopathic aseptic necrosis of unspecified bone: M87.00

## 2016-08-06 HISTORY — PX: TOTAL HIP ARTHROPLASTY: SHX124

## 2016-08-06 HISTORY — DX: Other chronic pain: G89.29

## 2016-08-06 HISTORY — DX: Other cervical disc degeneration, unspecified cervical region: M50.30

## 2016-08-06 HISTORY — DX: Dorsalgia, unspecified: M54.9

## 2016-08-06 HISTORY — DX: Unspecified asthma, uncomplicated: J45.909

## 2016-08-06 SURGERY — ARTHROPLASTY, HIP, TOTAL, ANTERIOR APPROACH
Anesthesia: Monitor Anesthesia Care | Laterality: Right

## 2016-08-06 MED ORDER — EPINEPHRINE PF 1 MG/ML IJ SOLN
INTRAMUSCULAR | Status: AC
  Filled 2016-08-06: qty 1

## 2016-08-06 MED ORDER — PHENYLEPHRINE HCL 10 MG/ML IJ SOLN
INTRAVENOUS | Status: DC | PRN
  Administered 2016-08-06: 20 ug/min via INTRAVENOUS

## 2016-08-06 MED ORDER — SENNOSIDES-DOCUSATE SODIUM 8.6-50 MG PO TABS: 1.0000 | ORAL_TABLET | Freq: Every evening | ORAL | Status: DC | PRN

## 2016-08-06 MED ORDER — NICOTINE 21 MG/24HR TD PT24
21.0000 mg | MEDICATED_PATCH | Freq: Every day | TRANSDERMAL | Status: DC
  Administered 2016-08-06 – 2016-08-08 (×3): 21 mg via TRANSDERMAL
  Filled 2016-08-06 (×3): qty 1

## 2016-08-06 MED ORDER — HYDROMORPHONE HCL 1 MG/ML IJ SOLN
1.0000 mg | INTRAMUSCULAR | Status: DC | PRN
  Administered 2016-08-06 (×2): 1 mg via INTRAVENOUS
  Filled 2016-08-06 (×2): qty 1

## 2016-08-06 MED ORDER — PHENOL 1.4 % MT LIQD: 1.0000 | OROMUCOSAL | Status: DC | PRN

## 2016-08-06 MED ORDER — VANCOMYCIN HCL IN DEXTROSE 1-5 GM/200ML-% IV SOLN
1000.0000 mg | INTRAVENOUS | Status: AC
  Administered 2016-08-07: 1000 mg via INTRAVENOUS
  Filled 2016-08-06: qty 200

## 2016-08-06 MED ORDER — METOCLOPRAMIDE HCL 5 MG/ML IJ SOLN
5.0000 mg | Freq: Three times a day (TID) | INTRAMUSCULAR | Status: DC | PRN
  Administered 2016-08-06: 10 mg via INTRAVENOUS
  Filled 2016-08-06: qty 2

## 2016-08-06 MED ORDER — TIZANIDINE HCL 2 MG PO TABS: 2.0000 mg | ORAL_TABLET | Freq: Four times a day (QID) | ORAL | 0 refills | Status: DC | PRN

## 2016-08-06 MED ORDER — ASPIRIN EC 325 MG PO TBEC
325.0000 mg | DELAYED_RELEASE_TABLET | Freq: Every day | ORAL | Status: DC
  Administered 2016-08-07 – 2016-08-08 (×2): 325 mg via ORAL
  Filled 2016-08-06 (×2): qty 1

## 2016-08-06 MED ORDER — BISACODYL 5 MG PO TBEC: 5.0000 mg | DELAYED_RELEASE_TABLET | Freq: Every day | ORAL | Status: DC | PRN

## 2016-08-06 MED ORDER — METOCLOPRAMIDE HCL 5 MG PO TABS: 5.0000 mg | ORAL_TABLET | Freq: Three times a day (TID) | ORAL | Status: DC | PRN

## 2016-08-06 MED ORDER — BUPIVACAINE-EPINEPHRINE (PF) 0.5% -1:200000 IJ SOLN
INTRAMUSCULAR | Status: DC | PRN
  Administered 2016-08-06: 50 mL via PERINEURAL

## 2016-08-06 MED ORDER — MIDAZOLAM HCL 2 MG/2ML IJ SOLN
INTRAMUSCULAR | Status: AC
  Filled 2016-08-06: qty 2

## 2016-08-06 MED ORDER — BUPIVACAINE IN DEXTROSE 0.75-8.25 % IT SOLN
INTRATHECAL | Status: DC | PRN
  Administered 2016-08-06: 15 mg via INTRATHECAL

## 2016-08-06 MED ORDER — PROPOFOL 500 MG/50ML IV EMUL
INTRAVENOUS | Status: DC | PRN
  Administered 2016-08-06: 75 ug/kg/min via INTRAVENOUS

## 2016-08-06 MED ORDER — BUPIVACAINE HCL (PF) 0.5 % IJ SOLN
INTRAMUSCULAR | Status: AC
  Filled 2016-08-06: qty 30

## 2016-08-06 MED ORDER — PROPOFOL 10 MG/ML IV BOLUS
INTRAVENOUS | Status: DC | PRN
  Administered 2016-08-06: 6 mg via INTRAVENOUS

## 2016-08-06 MED ORDER — METHOCARBAMOL 500 MG PO TABS
500.0000 mg | ORAL_TABLET | Freq: Four times a day (QID) | ORAL | Status: DC | PRN
  Administered 2016-08-06 – 2016-08-08 (×7): 500 mg via ORAL
  Filled 2016-08-06 (×7): qty 1

## 2016-08-06 MED ORDER — DEXAMETHASONE SODIUM PHOSPHATE 10 MG/ML IJ SOLN
10.0000 mg | Freq: Once | INTRAMUSCULAR | Status: AC
  Administered 2016-08-07: 10 mg via INTRAVENOUS
  Filled 2016-08-06: qty 1

## 2016-08-06 MED ORDER — FENTANYL CITRATE (PF) 100 MCG/2ML IJ SOLN
INTRAMUSCULAR | Status: DC | PRN
  Administered 2016-08-06: 100 ug via INTRAVENOUS

## 2016-08-06 MED ORDER — KCL IN DEXTROSE-NACL 20-5-0.45 MEQ/L-%-% IV SOLN
INTRAVENOUS | Status: DC
  Administered 2016-08-06: 13:00:00 via INTRAVENOUS
  Filled 2016-08-06: qty 1000

## 2016-08-06 MED ORDER — BUSPIRONE HCL 5 MG PO TABS
15.0000 mg | ORAL_TABLET | Freq: Three times a day (TID) | ORAL | Status: DC
  Administered 2016-08-06 – 2016-08-08 (×6): 15 mg via ORAL
  Filled 2016-08-06 (×6): qty 1

## 2016-08-06 MED ORDER — DOCUSATE SODIUM 100 MG PO CAPS
100.0000 mg | ORAL_CAPSULE | Freq: Two times a day (BID) | ORAL | Status: DC
  Administered 2016-08-06 – 2016-08-08 (×5): 100 mg via ORAL
  Filled 2016-08-06 (×5): qty 1

## 2016-08-06 MED ORDER — DIPHENHYDRAMINE HCL 12.5 MG/5ML PO ELIX: 12.5000 mg | ORAL_SOLUTION | ORAL | Status: DC | PRN

## 2016-08-06 MED ORDER — TRANEXAMIC ACID 1000 MG/10ML IV SOLN
1000.0000 mg | Freq: Once | INTRAVENOUS | Status: AC
  Administered 2016-08-06: 1000 mg via INTRAVENOUS
  Filled 2016-08-06: qty 10

## 2016-08-06 MED ORDER — EPHEDRINE SULFATE 50 MG/ML IJ SOLN
INTRAMUSCULAR | Status: DC | PRN
  Administered 2016-08-06: 5 mg via INTRAVENOUS

## 2016-08-06 MED ORDER — LACTATED RINGERS IV SOLN
INTRAVENOUS | Status: DC | PRN
  Administered 2016-08-06 (×2): via INTRAVENOUS

## 2016-08-06 MED ORDER — METHOCARBAMOL 1000 MG/10ML IJ SOLN
500.0000 mg | Freq: Four times a day (QID) | INTRAVENOUS | Status: DC | PRN
  Filled 2016-08-06: qty 5

## 2016-08-06 MED ORDER — ALBUMIN HUMAN 5 % IV SOLN
INTRAVENOUS | Status: DC | PRN
  Administered 2016-08-06: 09:00:00 via INTRAVENOUS

## 2016-08-06 MED ORDER — CHLORHEXIDINE GLUCONATE 4 % EX LIQD: 60.0000 mL | Freq: Once | CUTANEOUS | Status: DC

## 2016-08-06 MED ORDER — OXYCODONE HCL 5 MG PO TABS
5.0000 mg | ORAL_TABLET | ORAL | Status: DC | PRN
  Administered 2016-08-06 – 2016-08-08 (×16): 10 mg via ORAL
  Filled 2016-08-06 (×16): qty 2

## 2016-08-06 MED ORDER — HYDROMORPHONE HCL 1 MG/ML IJ SOLN
0.2500 mg | INTRAMUSCULAR | Status: DC | PRN
  Administered 2016-08-06 (×2): 0.5 mg via INTRAVENOUS

## 2016-08-06 MED ORDER — ALUM & MAG HYDROXIDE-SIMETH 200-200-20 MG/5ML PO SUSP
30.0000 mL | ORAL | Status: DC | PRN
Start: 2016-08-06 — End: 2016-08-08

## 2016-08-06 MED ORDER — ONDANSETRON HCL 4 MG/2ML IJ SOLN
4.0000 mg | Freq: Four times a day (QID) | INTRAMUSCULAR | Status: DC | PRN
  Administered 2016-08-07 (×3): 4 mg via INTRAVENOUS
  Filled 2016-08-06 (×4): qty 2

## 2016-08-06 MED ORDER — DIPHENHYDRAMINE HCL 25 MG PO CAPS
50.0000 mg | ORAL_CAPSULE | Freq: Two times a day (BID) | ORAL | Status: DC
Start: 2016-08-06 — End: 2016-08-08
  Administered 2016-08-06 – 2016-08-08 (×4): 50 mg via ORAL
  Filled 2016-08-06 (×4): qty 2

## 2016-08-06 MED ORDER — ASPIRIN EC 325 MG PO TBEC
325.0000 mg | DELAYED_RELEASE_TABLET | Freq: Two times a day (BID) | ORAL | 0 refills | Status: DC
Start: 2016-08-06 — End: 2016-10-30

## 2016-08-06 MED ORDER — PHENYLEPHRINE HCL 10 MG/ML IJ SOLN
INTRAMUSCULAR | Status: DC | PRN
  Administered 2016-08-06 (×3): 80 ug via INTRAVENOUS

## 2016-08-06 MED ORDER — ACETAMINOPHEN 650 MG RE SUPP: 650.0000 mg | Freq: Four times a day (QID) | RECTAL | Status: DC | PRN

## 2016-08-06 MED ORDER — FLEET ENEMA 7-19 GM/118ML RE ENEM: 1.0000 | ENEMA | Freq: Once | RECTAL | Status: DC | PRN

## 2016-08-06 MED ORDER — FENTANYL CITRATE (PF) 250 MCG/5ML IJ SOLN
INTRAMUSCULAR | Status: AC
  Filled 2016-08-06: qty 5

## 2016-08-06 MED ORDER — PROPOFOL 10 MG/ML IV BOLUS
INTRAVENOUS | Status: AC
  Filled 2016-08-06: qty 20

## 2016-08-06 MED ORDER — LAMOTRIGINE 100 MG PO TABS
300.0000 mg | ORAL_TABLET | ORAL | Status: DC
Start: 2016-08-07 — End: 2016-08-08
  Administered 2016-08-07 – 2016-08-08 (×2): 300 mg via ORAL
  Filled 2016-08-06 (×2): qty 3

## 2016-08-06 MED ORDER — MIDAZOLAM HCL 5 MG/5ML IJ SOLN
INTRAMUSCULAR | Status: DC | PRN
  Administered 2016-08-06: 2 mg via INTRAVENOUS

## 2016-08-06 MED ORDER — HYDROMORPHONE HCL 1 MG/ML IJ SOLN
INTRAMUSCULAR | Status: AC
  Filled 2016-08-06: qty 0.5

## 2016-08-06 MED ORDER — MENTHOL 3 MG MT LOZG: 1.0000 | LOZENGE | OROMUCOSAL | Status: DC | PRN

## 2016-08-06 MED ORDER — 0.9 % SODIUM CHLORIDE (POUR BTL) OPTIME
TOPICAL | Status: DC | PRN
  Administered 2016-08-06: 1000 mL

## 2016-08-06 MED ORDER — ONDANSETRON HCL 4 MG PO TABS
4.0000 mg | ORAL_TABLET | Freq: Four times a day (QID) | ORAL | Status: DC | PRN
  Administered 2016-08-06: 4 mg via ORAL
  Filled 2016-08-06: qty 1

## 2016-08-06 MED ORDER — PHENYLEPHRINE HCL 10 MG/ML IJ SOLN: INTRAMUSCULAR | Status: DC | PRN

## 2016-08-06 MED ORDER — ACETAMINOPHEN 325 MG PO TABS
650.0000 mg | ORAL_TABLET | Freq: Four times a day (QID) | ORAL | Status: DC | PRN
  Administered 2016-08-07 – 2016-08-08 (×5): 650 mg via ORAL
  Filled 2016-08-06 (×5): qty 2

## 2016-08-06 MED ORDER — LORATADINE 10 MG PO TABS: 10.0000 mg | ORAL_TABLET | Freq: Every day | ORAL | Status: DC | PRN

## 2016-08-06 MED ORDER — OXYCODONE-ACETAMINOPHEN 5-325 MG PO TABS: 1.0000 | ORAL_TABLET | ORAL | 0 refills | Status: DC | PRN

## 2016-08-06 SURGICAL SUPPLY — 42 items
BAG DECANTER FOR FLEXI CONT (MISCELLANEOUS) ×2 IMPLANT
BLADE SAW SGTL 18X1.27X75 (BLADE) ×2 IMPLANT
CAPT HIP TOTAL 2 ×2 IMPLANT
COVER PERINEAL POST (MISCELLANEOUS) ×2 IMPLANT
COVER SURGICAL LIGHT HANDLE (MISCELLANEOUS) ×2 IMPLANT
DRAPE C-ARM 42X72 X-RAY (DRAPES) ×2 IMPLANT
DRAPE STERI IOBAN 125X83 (DRAPES) ×2 IMPLANT
DRAPE U-SHAPE 47X51 STRL (DRAPES) ×4 IMPLANT
DRSG AQUACEL AG ADV 3.5X10 (GAUZE/BANDAGES/DRESSINGS) ×2 IMPLANT
DURAPREP 26ML APPLICATOR (WOUND CARE) ×2 IMPLANT
ELECT BLADE 4.0 EZ CLEAN MEGAD (MISCELLANEOUS) ×2
ELECT REM PT RETURN 9FT ADLT (ELECTROSURGICAL) ×2
ELECTRODE BLDE 4.0 EZ CLN MEGD (MISCELLANEOUS) ×1 IMPLANT
ELECTRODE REM PT RTRN 9FT ADLT (ELECTROSURGICAL) ×1 IMPLANT
FACESHIELD WRAPAROUND (MASK) ×4 IMPLANT
GLOVE BIO SURGEON STRL SZ7.5 (GLOVE) ×2 IMPLANT
GLOVE BIO SURGEON STRL SZ8.5 (GLOVE) ×2 IMPLANT
GLOVE BIOGEL PI IND STRL 8 (GLOVE) ×1 IMPLANT
GLOVE BIOGEL PI IND STRL 9 (GLOVE) ×1 IMPLANT
GLOVE BIOGEL PI INDICATOR 8 (GLOVE) ×1
GLOVE BIOGEL PI INDICATOR 9 (GLOVE) ×1
GOWN STRL REUS W/ TWL LRG LVL3 (GOWN DISPOSABLE) ×1 IMPLANT
GOWN STRL REUS W/ TWL XL LVL3 (GOWN DISPOSABLE) ×2 IMPLANT
GOWN STRL REUS W/TWL LRG LVL3 (GOWN DISPOSABLE) ×1
GOWN STRL REUS W/TWL XL LVL3 (GOWN DISPOSABLE) ×2
KIT BASIN OR (CUSTOM PROCEDURE TRAY) ×2 IMPLANT
KIT ROOM TURNOVER OR (KITS) ×2 IMPLANT
MANIFOLD NEPTUNE II (INSTRUMENTS) ×2 IMPLANT
NEEDLE HYPO 22GX1.5 SAFETY (NEEDLE) ×4 IMPLANT
NS IRRIG 1000ML POUR BTL (IV SOLUTION) ×2 IMPLANT
PACK TOTAL JOINT (CUSTOM PROCEDURE TRAY) ×2 IMPLANT
PAD ARMBOARD 7.5X6 YLW CONV (MISCELLANEOUS) ×4 IMPLANT
SUT VIC AB 1 CTX 36 (SUTURE) ×1
SUT VIC AB 1 CTX36XBRD ANBCTR (SUTURE) ×1 IMPLANT
SUT VIC AB 2-0 CT1 27 (SUTURE) ×1
SUT VIC AB 2-0 CT1 TAPERPNT 27 (SUTURE) ×1 IMPLANT
SUT VIC AB 3-0 PS2 18 (SUTURE) ×1
SUT VIC AB 3-0 PS2 18XBRD (SUTURE) ×1 IMPLANT
SYR CONTROL 10ML LL (SYRINGE) ×4 IMPLANT
TOWEL OR 17X24 6PK STRL BLUE (TOWEL DISPOSABLE) IMPLANT
TOWEL OR 17X26 10 PK STRL BLUE (TOWEL DISPOSABLE) IMPLANT
TRAY CATH 16FR W/PLASTIC CATH (SET/KITS/TRAYS/PACK) ×2 IMPLANT

## 2016-08-06 NOTE — Anesthesia Postprocedure Evaluation (Signed)
Anesthesia Post Note  Patient: Theresa Mckay  Procedure(s) Performed: Procedure(s) (LRB): TOTAL HIP ARTHROPLASTY ANTERIOR APPROACH (Right)  Patient location during evaluation: PACU Anesthesia Type: MAC and Spinal Level of consciousness: awake and alert Pain management: pain level controlled Vital Signs Assessment: post-procedure vital signs reviewed and stable Respiratory status: spontaneous breathing and respiratory function stable Cardiovascular status: blood pressure returned to baseline and stable Postop Assessment: spinal receding Anesthetic complications: no       Last Vitals:  Vitals:   08/06/16 1101 08/06/16 1114  BP:  (!) 94/55  Pulse:  (!) 58  Resp:  18  Temp: 36.2 C 36.7 C    Last Pain:  Vitals:   08/06/16 1750  TempSrc:   PainSc: 8                  Valena Ivanov,W. EDMOND

## 2016-08-06 NOTE — Transfer of Care (Signed)
Immediate Anesthesia Transfer of Care Note  Patient: Theresa Mckay  Procedure(s) Performed: Procedure(s): TOTAL HIP ARTHROPLASTY ANTERIOR APPROACH (Right)  Patient Location: PACU  Anesthesia Type:MAC and Spinal  Level of Consciousness: awake, alert , oriented and patient cooperative  Airway & Oxygen Therapy: Patient Spontanous Breathing and Patient connected to face mask oxygen  Post-op Assessment: Report given to RN and Post -op Vital signs reviewed and stable  Post vital signs: Reviewed and stable  Last Vitals:  Vitals:   08/06/16 0554  BP: 105/65  Pulse: 90  Resp: (!) 99  Temp: 36.9 C    Last Pain:  Vitals:   08/06/16 0554  TempSrc: Oral         Complications: No apparent anesthesia complications

## 2016-08-06 NOTE — Evaluation (Signed)
Physical Therapy Evaluation Patient Details Name: Theresa Mckay MRN: [id number] DOB: 08/22/1980 Today's Date: 08/06/2016   History of Present Illness  36 yo admitted for Right anterior THA. PMhx: AVN, PTSD, depression, anxiety  Clinical Impression  Pt stating increased pain and discomfort post op and unable to find a good position. Pt with decreased strength, ROM, transfers and gait all limited by pain currently despite premedication who will benefit from acute therapy to maximize mobility, function and gait to decrease caregiver burden. Pt stood and walked to recliner, sat grossly 8 min then demanded return to bed as pt stated she was not comfortable in the chair. Pt stated standing decreased her pain but walking did not and she refused increased ambulation. Will continue to follow.     Follow Up Recommendations Home health PT;Supervision/Assistance - 24 hour    Equipment Recommendations  Rolling walker with 5" wheels;3in1 (PT)    Recommendations for Other Services OT consult     Precautions / Restrictions Precautions Precautions: Fall Restrictions Weight Bearing Restrictions: Yes RLE Weight Bearing: Weight bearing as tolerated      Mobility  Bed Mobility Overal bed mobility: Needs Assistance Bed Mobility: Supine to Sit;Sit to Supine     Supine to sit: Min assist Sit to supine: Min assist   General bed mobility comments: cues for sequence with assist to move RLE to EOB and back to bed  Transfers Overall transfer level: Needs assistance   Transfers: Sit to/from Stand Sit to Stand: Min guard         General transfer comment: cues for hand placement, safety and sequence  Ambulation/Gait Ambulation/Gait assistance: Min assist Ambulation Distance (Feet): 5 Feet Assistive device: Rolling walker (2 wheeled) Gait Pattern/deviations: Step-to pattern;Decreased step length - right   Gait velocity interpretation: Below normal speed for age/gender General Gait Details:  cues for sequence, safety and RW use. pt walked 5' to and from bed  Stairs            Wheelchair Mobility    Modified Rankin (Stroke Patients Only)       Balance Overall balance assessment: No apparent balance deficits (not formally assessed)                                           Pertinent Vitals/Pain Pain Assessment: 0-10 Pain Score: 8  Pain Location: pt states 8 in bed, 6 in standing Pain Descriptors / Indicators: Burning;Pressure Pain Intervention(s): Limited activity within patient's tolerance;Repositioned;Ice applied;Premedicated before session;Patient requesting pain meds-RN notified;Monitored during session    Home Living Family/patient expects to be discharged to:: Private residence Living Arrangements: Children;Non-relatives/Friends Available Help at Discharge: Friend(s);Available PRN/intermittently Type of Home: House Home Access: Stairs to enter   Entrance Stairs-Number of Steps: 5 Home Layout: Two level;Bed/bath upstairs Home Equipment: None Additional Comments: pt lives in a split house but plans to stay with housemates downstairs immediately after returning home    Prior Function Level of Independence: Independent               Hand Dominance        Extremity/Trunk Assessment   Upper Extremity Assessment Upper Extremity Assessment: Overall WFL for tasks assessed    Lower Extremity Assessment Lower Extremity Assessment: RLE deficits/detail RLE Deficits / Details: decreased ROM and strength due to pain RLE: Unable to fully assess due to pain    Cervical / Trunk Assessment Cervical /  Trunk Assessment: Normal  Communication   Communication: No difficulties  Cognition Arousal/Alertness: Awake/alert Behavior During Therapy: WFL for tasks assessed/performed Overall Cognitive Status: Within Functional Limits for tasks assessed                                        General Comments       Exercises Total Joint Exercises Heel Slides: AROM;Right;Supine;5 reps   Assessment/Plan    PT Assessment Patient needs continued PT services  PT Problem List Decreased mobility;Decreased strength;Decreased activity tolerance;Decreased knowledge of use of DME;Pain       PT Treatment Interventions DME instruction;Gait training;Functional mobility training;Therapeutic activities;Therapeutic exercise;Patient/family education;Stair training    PT Goals (Current goals can be found in the Care Plan section)  Acute Rehab PT Goals Patient Stated Goal: return to walking, working and hiking PT Goal Formulation: With patient Time For Goal Achievement: 08/13/16 Potential to Achieve Goals: Good    Frequency 7X/week   Barriers to discharge Decreased caregiver support;Inaccessible home environment      Co-evaluation               End of Session Equipment Utilized During Treatment: Gait belt Activity Tolerance: Patient tolerated treatment well Patient left: in bed;with call bell/phone within reach;with nursing/sitter in room Nurse Communication: Mobility status;Precautions;Weight bearing status PT Visit Diagnosis: Other abnormalities of gait and mobility (R26.89);Difficulty in walking, not elsewhere classified (R26.2);Pain    Time: 1216-1250 PT Time Calculation (min) (ACUTE ONLY): 34 min   Charges:   PT Evaluation $PT Eval Moderate Complexity: 1 Procedure PT Treatments $Therapeutic Activity: 8-22 mins   PT G Codes:        Theresa Mckay, PT [phone]  Theresa Mckay 08/06/2016, 1:07 PM

## 2016-08-06 NOTE — Interval H&P Note (Signed)
History and Physical Interval Note:  08/06/2016 7:08 AM  Theresa Mckay  has presented today for surgery, with the diagnosis of RIGHT HIP AVASCULAR NECROSIS, DEVELOPMENTAL HIP DYSPLASIA  The various methods of treatment have been discussed with the patient and family. After consideration of risks, benefits and other options for treatment, the patient has consented to  Procedure(s): TOTAL HIP ARTHROPLASTY ANTERIOR APPROACH (Right) as a surgical intervention .  The patient's history has been reviewed, patient examined, no change in status, stable for surgery.  I have reviewed the patient's chart and labs.  Questions were answered to the patient's satisfaction.     [name]

## 2016-08-06 NOTE — Anesthesia Procedure Notes (Signed)
Spinal  Patient location during procedure: OR Start time: 08/06/2016 7:34 AM End time: 08/06/2016 7:39 AM Staffing Anesthesiologist: Gaynelle Adu Performed: anesthesiologist  Preanesthetic Checklist Completed: patient identified, surgical consent, pre-op evaluation, timeout performed, IV checked, risks and benefits discussed and monitors and equipment checked Spinal Block Patient position: sitting Prep: DuraPrep Patient monitoring: cardiac monitor, continuous pulse ox and blood pressure Approach: midline Location: L3-4 Injection technique: single-shot Needle Needle type: Pencan  Needle gauge: 24 G Needle length: 9 cm Assessment Sensory level: T8 Additional Notes Functioning IV was confirmed and monitors were applied. Sterile prep and drape, including hand hygiene and sterile gloves were used. The patient was positioned and the spine was prepped. The skin was anesthetized with lidocaine.  Free flow of clear CSF was obtained prior to injecting local anesthetic into the CSF.  The spinal needle aspirated freely following injection.  The needle was carefully withdrawn.  The patient tolerated the procedure well.

## 2016-08-06 NOTE — Discharge Instructions (Signed)

## 2016-08-06 NOTE — Interval H&P Note (Signed)
History and Physical Interval Note:  08/06/2016 7:09 AM  Theresa Mckay  has presented today for surgery, with the diagnosis of RIGHT HIP AVASCULAR NECROSIS, DEVELOPMENTAL HIP DYSPLASIA  The various methods of treatment have been discussed with the patient and family. After consideration of risks, benefits and other options for treatment, the patient has consented to  Procedure(s): TOTAL HIP ARTHROPLASTY ANTERIOR APPROACH (Right) as a surgical intervention .  The patient's history has been reviewed, patient examined, no change in status, stable for surgery.  I have reviewed the patient's chart and labs.  Questions were answered to the patient's satisfaction.     [name]

## 2016-08-06 NOTE — Op Note (Signed)
OPERATIVE REPORT    DATE OF PROCEDURE:  08/06/2016       PREOPERATIVE DIAGNOSIS:  RIGHT HIP AVASCULAR NECROSIS, DEVELOPMENTAL HIP DYSPLASIA                                                          POSTOPERATIVE DIAGNOSIS:  RIGHT HIP AVASCULAR NECROSIS DEVELOPMENTAL                                                            PROCEDURE: Anterior R total hip arthroplasty using a 48 mm DePuy Pinnacle  Cup, [employer], 0-degree polyethylene liner, a +1 32 mm ceramic head, a 4 std Depuy Triloc stem   SURGEON: Gean Birchwood J    ASSISTANT:   Eric K. [employer]  (present throughout entire procedure and necessary for timely completion of the procedure)   ANESTHESIA: spinal BLOOD LOSS: 750 FLUID REPLACEMENT: 1700 crystalloid Antibiotic: 1gm voncomycin iv Tranexamic Acid: 1gm iv, 2gm topical COMPLICATIONS: none    INDICATIONS FOR PROCEDURE: A 36 y.o. year-old With  RIGHT HIP AVASCULAR NECROSIS, DEVELOPMENTAL HIP DYSPLASIA   for 3 years, x-rays show bone-on-bone arthritic changes, and osteophytes. Despite conservative measures with observation, anti-inflammatory medicine, narcotics, use of a cane, has severe unremitting pain and can ambulate only a few blocks before resting. Patient desires elective R total hip arthroplasty to decrease pain and increase function. The risks, benefits, and alternatives were discussed at length including but not limited to the risks of infection, bleeding, nerve injury, stiffness, blood clots, the need for revision surgery, cardiopulmonary complications, among others, and they were willing to proceed. Questions answered     PROCEDURE IN DETAIL: The patient was identified by armband,  received preoperative IV antibiotics in the holding area at [facility], taken to the operating room , appropriate anesthetic monitors  were attached and  anesthesia was induced with the patienton the gurney. The HANA boots were applied to the feet and he was then  transferred to the HANA table with a peroneal post and support underneath the non-operative le, which was locked in 5 lb traction. Theoperative lower extremity was then prepped and draped in the usual sterile fashion from just above the iliac crest to the knee. And a timeout procedure was performed. We then made a 14 cm incision along the interval at the leading edge of the tensor fascia lata of starting at 2 cm lateral to and 2 cm distal to the ASIS. Small bleeders in the skin and subcutaneous tissue identified and cauterized we dissected down to the fascia and made an incision in the fascia allowing [place] to elevate the fascia of the tensor muscle and exploited the interval between the rectus and the tensor fascia lata. A Hohmann retractor was then placed along the superior neck of the femur and a Cobra retractor along the inferior neck of the femur we teed the capsule starting out at the superior anterior aspect of the acetabulum going distally and made the T along the neck both leaflets of the T were tagged with #2 Ethibond suture. Cobra retractors were then placed along the inferior  and superior neck allowing [place] to perform a standard neck cut and removed the femoral head with a power corkscrew. We then placed a right angle Hohmann retractor along the anterior aspect of the acetabulum a spiked Cobra in the cotyloid notch and posteriorly a Muelller retractor. We then sequentially reamed up to a 47 mm basket reamer obtaining good coverage in all quadrants, verified by C-arm imaging. Under C-arm control with and hammered into place a 48 mm Pinnacle cup in 45 of abduction and 15 of anteversion. The cup seated nicely and required no supplemental screws. We then placed a central hole Eliminator and a 0 polyethylene liner. The foot was then externally rotated to 110, the HANA elevator was placed around the flare of the greater trochanter and the limb was extended and abducted delivering the proximal femur up into the  wound. A medium Hohmann retractor was placed over the greater trochanter and a Mueller retractor along the posterior femoral neck completing the exposure. We then performed releases superiorly and and inferiorly of the capsule going back to the pirformis fossa superiorly and to the lesser trochanter inferiorly. We then entered the proximal femur with the box cutting offset chisel followed by, a canal sounder, the chili pepper and broaching up to a 4 broach. This seated nicely and we reamed the calcar. A trial reduction was performed with a 1 mm 32 mm head.The limb lengths were excellent the hip was stable in 90 of external rotation. At this point the trial components removed and we hammered into place a # 4 Tri-Lock stem with G[employer] coating. This was a std offset stem and a + 32 mm ceramic ball was then hammered into place the hip was reduced and final C-arm images obtained. The wound was thoroughly irrigated with normal saline solution. We repaired the ant capsule and the tensor fascia lot a with running 0 vicryl suture. the subcutaneous tissue was closed with 2-0 and 3-0 Vicryl suture followed by an Aquacil dressing. At this point the patient was awaken and transferred to hospital gurney without difficulty. The subcutaneous tissue with 0 and 2-0 undyed Vicryl suture and the skin with running  3-0 vicryl subcuticular suture. Aquacil dressing was applied. The patient was then unclamped, rolled supine, awaken extubated and taken to recovery room without difficulty in stable condition.   Gean Birchwood J 08/06/2016, 9:29 AM

## 2016-08-07 ENCOUNTER — Encounter (HOSPITAL_COMMUNITY): Payer: Self-pay | Admitting: Orthopedic Surgery

## 2016-08-07 LAB — CBC
HCT: 30 % — ABNORMAL LOW (ref 36.0–46.0)
Hemoglobin: 10.1 g/dL — ABNORMAL LOW (ref 12.0–15.0)
MCH: 31.5 pg (ref 26.0–34.0)
MCHC: 33.7 g/dL (ref 30.0–36.0)
MCV: 93.5 fL (ref 78.0–100.0)
PLATELETS: 236 10*3/uL (ref 150–400)
RBC: 3.21 MIL/uL — ABNORMAL LOW (ref 3.87–5.11)
RDW: 13.1 % (ref 11.5–15.5)
WBC: 8 10*3/uL (ref 4.0–10.5)

## 2016-08-07 NOTE — Progress Notes (Signed)
Pt said she would take bath later after breakfast

## 2016-08-07 NOTE — Progress Notes (Addendum)
RN notified Dannielle Burn, PA, that patient had redness on the right lower side of her aquacel dressing covering her surgical incision. Area is not warm to touch, patient complains of no pain. No further orders given to RN from [place].

## 2016-08-07 NOTE — Progress Notes (Signed)
PATIENT ID: Theresa Mckay  MRN: [id number]  DOB/AGE:  19-Sep-1980 / 36 y.o.  1 Day Post-Op Procedure(s) (LRB): TOTAL HIP ARTHROPLASTY ANTERIOR APPROACH (Right)    PROGRESS NOTE Subjective: Patient is alert, oriented, n0 Nausea, n0 Vomiting, yes passing gas, . Taking PO well. Denies SOB, Chest or Calf Pain. Using Incentive Spirometer, PAS in place. Ambulate WBAT, Patient has been up to the bathroom Patient reports pain as  5/10  .    Objective: Vital signs in last 24 hours: Vitals:   08/06/16 1101 08/06/16 1114 08/06/16 2004 08/07/16 0527  BP:  (!) 94/55 (!) 102/59 103/70  Pulse:  (!) 58 82 90  Resp:  Temp: 97.2 F (36.2 C) 98.1 F (36.7 C) 98.8 F (37.1 C) 98.9 F (37.2 C)  TempSrc:  Oral Oral Oral  SpO2:  100% 100% 98%      Intake/Output from previous day: I/O last 3 completed shifts: In: 1490 [P.O.:240; I.V.:1000; IV Piggyback:250] Out: 950 [Urine:200; Blood:750]   Intake/Output this shift: No intake/output data recorded.   LABORATORY DATA:  Recent Labs  08/07/16 0522  WBC 8.0  HGB 10.1*  HCT 30.0*  PLT 236    Examination: Neurologically intact ABD soft Neurovascular intact Sensation intact distally Intact pulses distally Dorsiflexion/Plantar flexion intact Incision: dressing C/D/I No cellulitis present Compartment soft} XR AP&Lat of hip shows well placed\fixed THA  Assessment:   1 Day Post-Op Procedure(s) (LRB): TOTAL HIP ARTHROPLASTY ANTERIOR APPROACH (Right) ADDITIONAL DIAGNOSIS:  Expected Acute Blood Loss Anemia,Depression, bipolar disease.  Plan: PT/OT WBAT, THA  DVT Prophylaxis: SCDx72 hrs, ASA 325 mg BID x 2 weeks  DISCHARGE PLAN: Home  DISCHARGE NEEDS: HHPT, Walker and 3-in-1 comode seatPatient ID: Theresa Mckay, female   DOB: 1980-06-20, 36 y.o.   MRN: [id number]

## 2016-08-07 NOTE — Progress Notes (Signed)
Physical Therapy Treatment Patient Details Name: Theresa Mckay MRN: [id number] DOB: September 20, 1980 Today's Date: 08/07/2016    History of Present Illness 36 yo admitted for Right anterior THA. PMhx: AVN, PTSD, depression, anxiety    PT Comments    Patient educated in anterior hip precautions as no call back from MD to clarify if direct anterior and no hip precautions.  Feel safest on stairs with rail and crutch despite her concerns over stability of rail as fearful for reverse technique.  Will follow up prior to d/c.   Follow Up Recommendations  Home health PT;Supervision/Assistance - 24 hour     Equipment Recommendations  Rolling walker with 5" wheels;3in1 (PT)    Recommendations for Other Services       Precautions / Restrictions Precautions Precautions: Fall;Anterior Hip Restrictions Weight Bearing Restrictions: Yes RLE Weight Bearing: Weight bearing as tolerated    Mobility  Bed Mobility Overal bed mobility: Needs Assistance Bed Mobility: Supine to Sit     Supine to sit: Supervision     General bed mobility comments: used blanket to help leg of EOB  Transfers Overall transfer level: Needs assistance Equipment used: Rolling walker (2 wheeled) Transfers: Sit to/from Stand Sit to Stand: Supervision         General transfer comment: good safety awareness  Ambulation/Gait Ambulation/Gait assistance: Supervision Ambulation Distance (Feet): 180 Feet Assistive device: Rolling walker (2 wheeled) Gait Pattern/deviations: Step-to pattern;Trunk flexed     General Gait Details: cues for step to pattern and safety with turns if anterior precautions   Stairs Stairs: Yes   Stair Management: One rail Right;Forwards;Step to pattern Number of Stairs: 4 General stair comments: cues for technique, initiated with reverse technique but pt declined to practice due to pain and fear  Wheelchair Mobility    Modified Rankin (Stroke Patients Only)       Balance Overall  balance assessment: Needs assistance         Standing balance support: Bilateral upper extremity supported Standing balance-Leahy Scale: Poor Standing balance comment: UE support for balance                            Cognition Arousal/Alertness: Awake/alert Behavior During Therapy: WFL for tasks assessed/performed Overall Cognitive Status: Within Functional Limits for tasks assessed                                        Exercises Total Joint Exercises Ankle Circles/Pumps: AROM;Both;10 reps;Supine Quad Sets: AROM;Right;10 reps;Supine Short Arc Quad: AROM;Right;10 reps;Supine    General Comments General comments (skin integrity, edema, etc.): Pt reports a red rash by her surgical wound. Pt has red rash by surgical area      Pertinent Vitals/Pain Pain Assessment: 0-10 Pain Score: 6  Pain Location: no pain standing, R hip with ambulation and sitting and supine Pain Descriptors / Indicators: Aching Pain Intervention(s): Monitored during session    Home Living Family/patient expects to be discharged to:: Private residence Living Arrangements: Children;Non-relatives/Friends Available Help at Discharge: Friend(s);Available PRN/intermittently Type of Home: House Home Access: Stairs to enter   Home Layout: Two level;Bed/bath upstairs Home Equipment: None Additional Comments: pt lives in a split house but plans to stay with housemates downstairs immediately after returning home    Prior Function Level of Independence: Independent          PT Goals (current goals can  now be found in the care plan section) Acute Rehab PT Goals Patient Stated Goal: return to walking, working and hiking Progress towards PT goals: Progressing toward goals    Frequency    7X/week      PT Plan      Co-evaluation             End of Session Equipment Utilized During Treatment: Gait belt Activity Tolerance: Patient tolerated treatment well Patient  left: in chair;Other (comment) (in gym with OT)   PT Visit Diagnosis: Other abnormalities of gait and mobility (R26.89);Difficulty in walking, not elsewhere classified (R26.2);Pain     Time: 1340-1405 PT Time Calculation (min) (ACUTE ONLY): 25 min  Charges:  $Gait Training: 8-22 mins $Therapeutic Exercise: 8-22 mins                    G CodesSheran Mckay, [name] [phone] 08/07/2016    Theresa Mckay 08/07/2016, 2:13 PM

## 2016-08-07 NOTE — Progress Notes (Signed)
Physical Therapy Treatment Patient Details Name: Theresa Mckay MRN: [id number] DOB: Aug 18, 1980 Today's Date: 08/07/2016    History of Present Illness 36 yo admitted for Right anterior THA. PMhx: AVN, PTSD, depression, anxiety    PT Comments    Patient was able to complete HEP this session and able to recall anterior hip precautions. Continue to progress as tolerated with anticipated d/c home with HHPT.    Follow Up Recommendations  Home health PT;Supervision/Assistance - 24 hour     Equipment Recommendations  Rolling walker with 5" wheels;3in1 (PT)    Recommendations for Other Services OT consult     Precautions / Restrictions Precautions Precautions: Fall;Anterior Hip Restrictions Weight Bearing Restrictions: Yes RLE Weight Bearing: Weight bearing as tolerated    Mobility  Bed Mobility Overal bed mobility: Needs Assistance Bed Mobility: Supine to Sit;Sit to Supine     Supine to sit: Supervision Sit to supine: Supervision   General bed mobility comments: pt used blanket as leg lifter to mobilize R LE; cues for precautions  Transfers Overall transfer level: Needs assistance Equipment used: Rolling walker (2 wheeled) Transfers: Sit to/from Stand Sit to Stand: Supervision         General transfer comment: good safety awareness from EOB and BSC  Ambulation/Gait Ambulation/Gait assistance: Supervision Ambulation Distance (Feet):  (13ft X2) Assistive device: Rolling walker (2 wheeled) Gait Pattern/deviations: Decreased stance time - right;Decreased step length - left;Decreased weight shift to right;Step-to pattern     General Gait Details: pt with good adherence to precautions when turning and taking steps backwards; raised RW and pt able to achieve more upright posture   Stairs Stairs: Yes   Stair Management: One rail Right;Forwards;Step to pattern Number of Stairs: 4 General stair comments: cues for technique, initiated with reverse technique but pt  declined to practice due to pain and fear  Wheelchair Mobility    Modified Rankin (Stroke Patients Only)       Balance Overall balance assessment: Needs assistance         Standing balance support: Bilateral upper extremity supported Standing balance-Leahy Scale: Poor Standing balance comment: UE support for balance                            Cognition Arousal/Alertness: Awake/alert Behavior During Therapy: WFL for tasks assessed/performed Overall Cognitive Status: Within Functional Limits for tasks assessed                                        Exercises Total Joint Exercises Ankle Circles/Pumps: AROM;Both;10 reps;Supine Quad Sets: AROM;Right;10 reps Short Arc Quad: AROM;Right;10 reps Heel Slides: AROM;Right;10 reps Long Arc Quad: AROM;Right;10 reps Knee Flexion: AROM;Right;10 reps;Standing Marching in Standing: AROM;Right;10 reps;Standing    General Comments        Pertinent Vitals/Pain Pain Assessment: 0-10 Pain Score: 7  Pain Location: R hip Pain Descriptors / Indicators: Aching;Guarding;Sore Pain Intervention(s): Limited activity within patient's tolerance;Monitored during session;Premedicated before session;Repositioned;Ice applied    Home Living                      Prior Function            PT Goals (current goals can now be found in the care plan section) Progress towards PT goals: Progressing toward goals    Frequency    7X/week      PT  Plan Current plan remains appropriate    Co-evaluation             End of Session Equipment Utilized During Treatment: Gait belt Activity Tolerance: Patient tolerated treatment well Patient left: in bed;with call bell/phone within reach Nurse Communication: Mobility status PT Visit Diagnosis: Other abnormalities of gait and mobility (R26.89);Difficulty in walking, not elsewhere classified (R26.2);Pain Pain - Right/Left: Right Pain - part of body: Hip      Time: 1610-9604 PT Time Calculation (min) (ACUTE ONLY): 37 min  Charges:  $Gait Training: 8-22 mins $Therapeutic Exercise: 8-22 mins                    G Codes:       Erline Levine, PTA Pager: [phone]     Carolynne Edouard 08/07/2016, 4:35 PM

## 2016-08-07 NOTE — Progress Notes (Signed)
Occupational Therapy Treatment Patient Details Name: Theresa Mckay MRN: [id number] DOB: Nov 26, 1980 Today's Date: 08/07/2016    History of present illness 36 yo admitted for Right anterior THA. PMhx: AVN, PTSD, depression, anxiety   OT comments  Pt demonstrating progress towards goals. Educated pt on walk-in shower transfer with 3N1. Pt demonstrated understanding and performed transfer with supervision. Feel pt will progress well with time. Answered all questions, provided education, and reviewed anterior hip precautions. Recommend dc home once medically stable per physician. All acute OT needs met. Will sign off.   Follow Up Recommendations  No OT follow up;Supervision/Assistance - 24 hour    Equipment Recommendations  3 in 1 bedside commode    Recommendations for Other Services      Precautions / Restrictions Precautions Precautions: Fall;Anterior Hip Precaution Booklet Issued: Yes (comment) Precaution Comments: Reviewed anterior hip precautions Restrictions Weight Bearing Restrictions: Yes RLE Weight Bearing: Weight bearing as tolerated       Mobility Bed Mobility Overal bed mobility: Needs Assistance Bed Mobility: Sit to Supine     Supine to sit: Supervision Sit to supine: Min assist   General bed mobility comments: Min A for RLE   Transfers Overall transfer level: Needs assistance Equipment used: Rolling walker (2 wheeled) Transfers: Sit to/from Stand Sit to Stand: Min guard;Supervision         General transfer comment: Demonstrates good hand placement    Balance Overall balance assessment: No apparent balance deficits (not formally assessed)         Standing balance support: Bilateral upper extremity supported Standing balance-Leahy Scale: Poor Standing balance comment: UE support for balance                           ADL either performed or assessed with clinical judgement   ADL Overall ADL's : Needs assistance/impaired                                  Tub/ Shower Transfer: Walk-in shower;Supervision/safety;3 in [employer] certified [employer] Details (indicate cue type and reason): Educated pt on safe transfer technique. Pt dmeosntrates understanded and perform shower transfer x2 with superivion Functional mobility during ADLs: Min guard;Rolling walker (performed fucntional mobility ~300 ft back to her room) General ADL Comments: Continues to show progress.     Vision       Perception     Praxis      Cognition Arousal/Alertness: Awake/alert Behavior During Therapy: WFL for tasks assessed/performed Overall Cognitive Status: Within Functional Limits for tasks assessed                                          Exercises Total Joint Exercises Ankle Circles/Pumps: AROM;Both;10 reps;Supine Quad Sets: AROM;Right;10 reps Short Arc Quad: AROM;Right;10 reps Heel Slides: AROM;Right;10 reps Long Arc Quad: AROM;Right;10 reps Knee Flexion: AROM;Right;10 reps;Standing Marching in Standing: AROM;Right;10 reps;Standing   Shoulder Instructions       General Comments Pt continues to report concern about rash    Pertinent Vitals/ Pain       Pain Assessment: Faces Pain Score: 7  Pain Location: R hip Pain Descriptors / Indicators: Burning;Pressure Pain Intervention(s): Monitored during session  Home Living  Prior Functioning/Environment              Frequency  Min 2X/week        Progress Toward Goals  OT Goals(current goals can now be found in the care plan section)     Acute Rehab OT Goals Patient Stated Goal: return to walking, working and hiking OT Goal Formulation: With patient Time For Goal Achievement: 08/21/16 Potential to Achieve Goals: Good ADL Goals Pt Will Perform Tub/Shower Transfer: Shower transfer;3 in 1;rolling walker;ambulating;with supervision  Plan Discharge plan remains  appropriate    Co-evaluation                 End of Session Equipment Utilized During Treatment: Gait belt;Rolling walker  OT Visit Diagnosis: Unsteadiness on feet (R26.81);Muscle weakness (generalized) (M62.81);Pain Pain - Right/Left: Right Pain - part of body: Leg;Hip   Activity Tolerance Patient tolerated treatment well   Patient Left with call bell/phone within reach;in bed   Nurse Communication Mobility status;Patient requests pain meds        Time: 2897-9150 OT Time Calculation (min): 26 min  Charges: OT General Charges $OT Visit: 1 Procedure OT Treatments $Self Care/Home Management : 8-22 mins $Therapeutic Activity: 8-22 mins  [place], OTR/L [place] 08/07/2016, 5:23 PM

## 2016-08-07 NOTE — Progress Notes (Signed)
RN notified Dannielle Burn, PA, that redness coming from underneath patient's surgical dressing has increased in size from RN's previous assessment. PA stated that he would make note of this and assess in AM and requested that RN mark skin with skin marker to see if redness continues to increase. Patient complains of no itching, VSS, does complain of post surgical pain.

## 2016-08-07 NOTE — Evaluation (Signed)
Occupational Therapy Evaluation Patient Details Name: Theresa Mckay MRN: [id number] DOB: December 11, 1980 Today's Date: 08/07/2016    History of Present Illness 36 yo admitted for Right anterior THA. PMhx: AVN, PTSD, depression, anxiety   Clinical Impression   PTA, pt was independent and living with her daughter and close friends. Pt currently requires Min guard for ADLs, toilet transfer using 3N1, and functional mobility with RW. Educated pt on anterior hip precautions; pt verbalized understanding. Pt would benefit from further skilled OT to address safe shower transfer techniques and increased pt independence with ADLs. Recommend pt dc home once medically stable per physician.     Follow Up Recommendations  No OT follow up;Supervision/Assistance - 24 hour    Equipment Recommendations  3 in 1 bedside commode    Recommendations for Other Services       Precautions / Restrictions Precautions Precautions: Fall Restrictions Weight Bearing Restrictions: Yes RLE Weight Bearing: Weight bearing as tolerated      Mobility Bed Mobility Overal bed mobility: Needs Assistance Bed Mobility: Supine to Sit;Sit to Supine     Supine to sit: Min assist     General bed mobility comments: Provided pt with education on using sheet for facilitate RLE movement towards EOB  Transfers Overall transfer level: Needs assistance   Transfers: Sit to/from Stand Sit to Stand: Min guard         General transfer comment: cues for hand placement, safety and sequence    Balance Overall balance assessment: No apparent balance deficits (not formally assessed)                                         ADL either performed or assessed with clinical judgement   ADL Overall ADL's : Needs assistance/impaired Eating/Feeding: Set up;Sitting   Grooming: Oral care;Wash/dry hands;Set up;Standing;Min guard   Upper Body Bathing: Set up;Sitting   Lower Body Bathing: Min guard;Sit to/from  stand   Upper Body Dressing : Set up;Sitting   Lower Body Dressing: Min guard;Sit to/from stand Lower Body Dressing Details (indicate cue type and reason): Pt donned pants with Min guard for sit<>stand Toilet Transfer: Min Social worker Details (indicate cue type and reason): Demonstrates good control       Tub/Shower Transfer Details (indicate cue type and reason): Needs walk-in shower training and education with 3N1 Functional mobility during ADLs: Min guard;Rolling walker General ADL Comments: Pt demosntrates good funcitonal performance. Feel she will progress well with time.      Vision Baseline Vision/History: Wears glasses Wears Glasses: At all times Patient Visual Report: No change from baseline       Perception     Praxis      Pertinent Vitals/Pain Pain Assessment: 0-10 Pain Score: 7  Pain Descriptors / Indicators: Burning;Pressure Pain Intervention(s): Monitored during session     Hand Dominance Right   Extremity/Trunk Assessment Upper Extremity Assessment Upper Extremity Assessment: Overall WFL for tasks assessed   Lower Extremity Assessment Lower Extremity Assessment: Defer to PT evaluation;RLE deficits/detail   Cervical / Trunk Assessment Cervical / Trunk Assessment: Normal   Communication Communication Communication: No difficulties   Cognition Arousal/Alertness: Awake/alert Behavior During Therapy: WFL for tasks assessed/performed Overall Cognitive Status: Within Functional Limits for tasks assessed  General Comments  Pt reports a red rash by her surgical wound. Pt has red rash by surgical area    Exercises     Shoulder Instructions      Home Living Family/patient expects to be discharged to:: Private residence Living Arrangements: Children;Non-relatives/Friends Available Help at Discharge: Friend(s);Available PRN/intermittently Type of Home: House Home Access:  Stairs to enter Entrance Stairs-Number of Steps: 5   Home Layout: Two level;Bed/bath upstairs Alternate Level Stairs-Number of Steps: 15   Bathroom Shower/Tub: Walk-in shower;Tub/shower unit (Will be using walk in shower)   Bathroom Toilet: Standard     Home Equipment: None   Additional Comments: pt lives in a split house but plans to stay with housemates downstairs immediately after returning home      Prior Functioning/Environment Level of Independence: Independent                 OT Problem List: Decreased strength;Decreased activity tolerance;Impaired balance (sitting and/or standing);Decreased knowledge of use of DME or AE;Decreased knowledge of precautions;Pain      OT Treatment/Interventions: Self-care/ADL training;Therapeutic exercise;Energy conservation;DME and/or AE instruction;Therapeutic activities;Patient/family education    OT Goals(Current goals can be found in the care plan section) Acute Rehab OT Goals Patient Stated Goal: return to walking, working and hiking OT Goal Formulation: With patient Time For Goal Achievement: 08/21/16 Potential to Achieve Goals: Good ADL Goals Pt Will Perform Tub/Shower Transfer: Shower transfer;3 in 1;rolling walker;ambulating;with supervision  OT Frequency: Min 2X/week   Barriers to D/C:            Co-evaluation              End of Session Equipment Utilized During Treatment: Gait belt;Rolling walker Nurse Communication: Mobility status;Patient requests pain meds  Activity Tolerance: Patient tolerated treatment well Patient left: in chair;with call bell/phone within reach  OT Visit Diagnosis: Unsteadiness on feet (R26.81);Muscle weakness (generalized) (M62.81);Pain Pain - Right/Left: Right Pain - part of body: Leg;Hip                Time: 1131-1200 OT Time Calculation (min): 29 min Charges:  OT General Charges $OT Visit: 1 Procedure OT Evaluation $OT Eval Low Complexity: 1 Procedure OT  Treatments $Self Care/Home Management : 8-22 mins G-Codes:     [employer], OTR/L [phone]  Theodoro Grist Vanessa Alesi 08/07/2016, 1:02 PM

## 2016-08-08 LAB — CBC
HCT: 27.6 % — ABNORMAL LOW (ref 36.0–46.0)
HEMOGLOBIN: 9.4 g/dL — AB (ref 12.0–15.0)
MCH: 31.9 pg (ref 26.0–34.0)
MCHC: 34.1 g/dL (ref 30.0–36.0)
MCV: 93.6 fL (ref 78.0–100.0)
Platelets: 229 10*3/uL (ref 150–400)
RBC: 2.95 MIL/uL — ABNORMAL LOW (ref 3.87–5.11)
RDW: 13.3 % (ref 11.5–15.5)
WBC: 11.5 10*3/uL — ABNORMAL HIGH (ref 4.0–10.5)

## 2016-08-08 MED ORDER — TIZANIDINE HCL 4 MG PO TABS
2.0000 mg | ORAL_TABLET | Freq: Four times a day (QID) | ORAL | Status: DC | PRN
  Administered 2016-08-08: 2 mg via ORAL
  Filled 2016-08-08: qty 1

## 2016-08-08 NOTE — Progress Notes (Addendum)
PATIENT ID: Theresa Mckay  MRN: [id number]  DOB/AGE:  09-09-1980 / 36 y.o.  35 Days Post-Op Procedure(s) (LRB): TOTAL HIP ARTHROPLASTY ANTERIOR APPROACH (Right)    PROGRESS NOTE Subjective: Patient is alert, oriented, no Nausea, no Vomiting, yes passing gas, . Taking PO well. Denies SOB, Chest or Calf Pain. Using Incentive Spirometer, PAS in place. Ambulate WBAT with pt walking 180 ft and navigating steps Patient reports pain as  moderate .    Objective: Vital signs in last 24 hours: Vitals:   08/07/16 1254 08/07/16 1504 08/07/16 2023 08/08/16 0500  BP: 125/68  110/66 (!) 107/55  Pulse: 93  89 84  Resp: Temp: 98.9 F (37.2 C) 98.8 F (37.1 C) 98.9 F (37.2 C) 98.1 F (36.7 C)  TempSrc:  Oral Oral Oral  SpO2: 98%  97% 98%      Intake/Output from previous day: I/O last 3 completed shifts: In: 480 [P.O.:480] Out: -    Intake/Output this shift: No intake/output data recorded.   LABORATORY DATA:  Recent Labs  08/07/16 0522 08/08/16 0727  WBC 8.0 11.5*  HGB 10.1* 9.4*  HCT 30.0* 27.6*  PLT 236 229    Examination: Neurologically intact Neurovascular intact Sensation intact distally Intact pulses distally Dorsiflexion/Plantar flexion intact Incision: dressing C/D/I and no drainage No cellulitis present Compartment soft}  Pt has an area of bruising extending from the bandage toward the groin.  It has been outlined by the nurse and has advanced minimally over night  Assessment:   35 Days Post-Op Procedure(s) (LRB): TOTAL HIP ARTHROPLASTY ANTERIOR APPROACH (Right) ADDITIONAL DIAGNOSIS:  Expected Acute Blood Loss Anemia, Depression, bipolar disease.  Plan: PT/OT WBAT, THA  DVT Prophylaxis: SCDx72 hrs, ASA 325 mg BID x 2 weeks  DISCHARGE PLAN: Home, today  DISCHARGE NEEDS: HHPT, Walker and 3-in-1 comode seat

## 2016-08-08 NOTE — Care Management Note (Signed)
Case Management Note  Patient Details  Name: Theresa Mckay MRN: [id number] Date of Birth: Dec 21, 1980  Subjective/Objective:                 Patient admitted from home with AVN total hip replacement. Will DC to home with [facility], would like to use [facility], needs 3 in 1 and RW. Referral for [facility] and DME placed to [facility]   Action/Plan:  DC to home with [facility] and DME Expected Discharge Date:  08/08/16               Expected Discharge Plan:  Home w Home Health Services  In-House Referral:     Discharge planning Services  CM Consult  Post Acute Care Choice:  Home Health Choice offered to:  Patient  DME Arranged:  3-N-1, Walker rolling DME Agency:  [facility]  HH Arranged:  PT, OT, Social Work [employer] Agency:     Status of Service:  Completed, signed off  If discussed at [employer], dates discussed:    Additional Comments:  Lawerance Sabal, RN 08/08/2016, 11:42 AM

## 2016-08-08 NOTE — Progress Notes (Signed)
Physical Therapy Treatment Patient Details Name: Theresa Mckay MRN: [id number] DOB: 02/16/1981 Today's Date: 08/08/2016    History of Present Illness 36 yo admitted for Right anterior THA. PMhx: AVN, PTSD, depression, anxiety    PT Comments    Pt performed progression of gait, review of stair training and review of HEP.  Pt assisted for set-up to take shower post treatment.  Pt is able to verbalize and maintain anterior hip precautions during session.  Pt is ready to d/c home from a mobility standpoint.    Follow Up Recommendations  Home health PT;Supervision/Assistance - 24 hour     Equipment Recommendations  Rolling walker with 5" wheels;3in1 (PT)    Recommendations for Other Services OT consult     Precautions / Restrictions Precautions Precautions: Fall;Anterior Hip Precaution Booklet Issued: Yes (comment) Precaution Comments: Reviewed anterior hip precautions Restrictions Weight Bearing Restrictions: Yes RLE Weight Bearing: Weight bearing as tolerated    Mobility  Bed Mobility Overal bed mobility: Needs Assistance Bed Mobility: Sit to Supine     Supine to sit: Supervision     General bed mobility comments: Min A for RLE   Transfers Overall transfer level: Modified independent Equipment used: Rolling walker (2 wheeled) Transfers: Sit to/from Stand Sit to Stand: Modified independent (Device/Increase time)         General transfer comment: Demonstrates good hand placement and technique.    Ambulation/Gait Ambulation/Gait assistance: Modified independent (Device/Increase time) Ambulation Distance (Feet): 250 Feet Assistive device: Rolling walker (2 wheeled) Gait Pattern/deviations: Decreased stance time - right;Decreased step length - left;Decreased weight shift to right;Step-to pattern   Gait velocity interpretation: Below normal speed for age/gender General Gait Details: Cues for progression to step through pattern, cues to keep RW rolling.      Stairs Stairs: Yes   Stair Management: One rail Right;Forwards;Sideways Number of Stairs: 5 General stair comments: Cues for sequencing and hand placement on railing.    Wheelchair Mobility    Modified Rankin (Stroke Patients Only)       Balance Overall balance assessment: No apparent balance deficits (not formally assessed)                                          Cognition Arousal/Alertness: Awake/alert Behavior During Therapy: WFL for tasks assessed/performed Overall Cognitive Status: Within Functional Limits for tasks assessed                                        Exercises Total Joint Exercises Ankle Circles/Pumps: AROM;Both;10 reps;Supine Quad Sets: AROM;Right;10 reps;Supine Short Arc Quad: AROM;Right;10 reps;Supine Heel Slides: AROM;Right;10 reps;Supine Long Arc Quad: AROM;Right;10 reps;Seated Knee Flexion: AROM;Right;10 reps;Standing Marching in Standing: AROM;Right;10 reps;Standing    General Comments        Pertinent Vitals/Pain Pain Assessment: 0-10 Pain Score: 7  Pain Location: R hip Pain Descriptors / Indicators: Burning;Pressure Pain Intervention(s): Monitored during session;Repositioned    Home Living                      Prior Function            PT Goals (current goals can now be found in the care plan section) Acute Rehab PT Goals Patient Stated Goal: return to walking, working and hiking Potential to Achieve Goals: Good Progress towards  PT goals: Progressing toward goals    Frequency    7X/week      PT Plan Current plan remains appropriate    Co-evaluation             End of Session Equipment Utilized During Treatment: Gait belt Activity Tolerance: Patient tolerated treatment well Patient left: in bed;with call bell/phone within reach Nurse Communication: Mobility status PT Visit Diagnosis: Other abnormalities of gait and mobility (R26.89);Difficulty in walking, not  elsewhere classified (R26.2);Pain Pain - Right/Left: Right Pain - part of body: Hip     Time: 0937-1020 PT Time Calculation (min) (ACUTE ONLY): 43 min  Charges:  $Gait Training: 8-22 mins $Therapeutic Exercise: 8-22 mins $Therapeutic Activity: 8-22 mins                    G Codes:       Joycelyn Rua, PTA pager [phone]    Florestine Avers 08/08/2016, 10:26 AM

## 2016-08-08 NOTE — Progress Notes (Signed)
Patient provided with discharge paperwork and prescriptions. Patient verbalized understanding of discharge information after education by RN. Patient has bilateral TED stockings in place. Patient's DME 3-n-1 and front wheel walker at bedside for patient to take home with her. IV removed.

## 2016-08-08 NOTE — Discharge Summary (Signed)
Patient ID: Theresa Mckay MRN: [id number] DOB/AGE: 1980-11-29 36 y.o.  Admit date: 08/06/2016 Discharge date: 08/08/2016  Admission Diagnoses:  Principal Problem:   Avascular necrosis of hip, right [facility]) Active Problems:   Arthritis of right hip   Discharge Diagnoses:  Same  Past Medical History:  Diagnosis Date  . Anxiety   . Arthritis    "hip, left knee, hands" (08/06/2016)  . Avascular necrosis (HCC)    "hips" (08/06/2016)  . Bipolar 1 disorder (HCC)   . Childhood asthma   . Chronic back pain    "all over my back" (08/06/2016)  . Chronic bronchitis (HCC)   . DDD (degenerative disc disease), cervical   . Depression   . Hip pain   . Pneumonia 2015  . PTSD (post-traumatic stress disorder) dx'd 2012  . Seizures (HCC)    "non epilleptic; not often; usually when they're changing my seizure RX around" (08/06/2016)    Surgeries: Procedure(s): TOTAL HIP ARTHROPLASTY ANTERIOR APPROACH on 08/06/2016   Consultants:   Discharged Condition: Improved  Hospital Course: Theresa Mckay is an 36 y.o. female who was admitted 08/06/2016 for operative treatment ofAvascular necrosis of hip, right (HCC). Patient has severe unremitting pain that affects sleep, daily activities, and work/hobbies. After pre-op clearance the patient was taken to the operating room on 08/06/2016 and underwent  Procedure(s): TOTAL HIP ARTHROPLASTY ANTERIOR APPROACH.    Patient was given perioperative antibiotics: Anti-infectives    Start     Dose/Rate Route Frequency Ordered Stop   08/07/16 0800  vancomycin (VANCOCIN) IVPB 1000 mg/200 mL premix     1,000 mg 200 mL/hr over 60 Minutes Intravenous Every 24 hours 08/06/16 1124 08/07/16 0948   08/06/16 0615  vancomycin (VANCOCIN) 1,500 mg in sodium chloride 0.9 % 500 mL IVPB     1,500 mg 250 mL/hr over 120 Minutes Intravenous On call to O.R. 08/03/16 1337 08/06/16 0900       Patient was given sequential compression devices, early ambulation, and chemoprophylaxis  to prevent DVT.  Patient benefited maximally from hospital stay and there were no complications.    Recent vital signs: Patient Vitals for the past 24 hrs:  BP Temp Temp src Pulse Resp SpO2  08/08/16 0500 (!) 107/55 98.1 F (36.7 C) Oral 84 18 98 %  08/07/16 2023 110/66 98.9 F (37.2 C) Oral 89 18 97 %  08/07/16 1504 - 98.8 F (37.1 C) Oral - - -  08/07/16 1254 125/68 98.9 F (37.2 C) - 93 18 98 %     Recent laboratory studies:  Recent Labs  08/07/16 0522 08/08/16 0727  WBC 8.0 11.5*  HGB 10.1* 9.4*  HCT 30.0* 27.6*  PLT 236 229     Discharge Medications:   Allergies as of 08/08/2016      Reactions   Latex    Hives, swelling   Zyprexa [olanzapine]    Becomes psychotic   Gabapentin    Increased BP   Augmentin [amoxicillin-pot Clavulanate]    Vomiting, diarrhea   Tramadol    rash      Medication List    STOP taking these medications   ibuprofen 600 MG tablet Commonly known as:  ADVIL,MOTRIN     TAKE these medications   aspirin EC 325 MG tablet Take 1 tablet (325 mg total) by mouth 2 (two) times daily.   busPIRone 15 MG tablet Commonly known as:  BUSPAR Take 1 tablet (15 mg total) by mouth 3 (three) times daily.   diphenhydrAMINE 25 mg capsule  Commonly known as:  BENADRYL Take 50 mg by mouth 2 (two) times daily.   lamoTRIgine 150 MG tablet Commonly known as:  LAMICTAL Take 300 mg by mouth every morning.   loratadine 10 MG tablet Commonly known as:  CLARITIN Take 10 mg by mouth daily as needed for allergies.   nicotine 21 mg/24hr patch Commonly known as:  NICODERM CQ - dosed in mg/24 hours Place 1 patch (21 mg total) onto the skin daily. What changed:  when to take this  reasons to take this   oxyCODONE-acetaminophen 5-325 MG tablet Commonly known as:  ROXICET Take 1-2 tablets by mouth every 4 (four) hours as needed.   tiZANidine 2 MG tablet Commonly known as:  ZANAFLEX Take 1 tablet (2 mg total) by mouth every 6 (six) hours as needed  for muscle spasms.            Durable Medical Equipment        Start     Ordered   08/06/16 1125  DME Walker rolling  Once    Question:  Patient needs a walker to treat with the following condition  Answer:  Arthritis of right hip   08/06/16 1124   08/06/16 1125  DME 3 n 1  Once     08/06/16 1124   08/06/16 1125  DME Bedside commode  Once    Question:  Patient needs a bedside commode to treat with the following condition  Answer:  Arthritis of right hip   08/06/16 1124      Diagnostic Studies: Dg C-arm 61-120 Min  Addendum Date: 08/06/2016   ADDENDUM REPORT: 08/06/2016 09:52 ADDENDUM: No metallic foreign bodies noted. Electronically Signed   By: Maisie Fus  Register   On: 08/06/2016 09:52   Result Date: 08/06/2016 CLINICAL DATA:  Hip replacement. EXAM: OPERATIVE RIGHT HIP (WITH PELVIS IF PERFORMED) 5 VIEWS TECHNIQUE: Fluoroscopic spot image(s) were submitted for interpretation post-operatively. COMPARISON:  05/26/2016. FINDINGS: Total right hip replacement good anatomic alignment. Hardware intact. No acute bony abnormality. IMPRESSION: Total right hip replacement with good anatomic alignment. No acute abnormality. Electronically Signed: By: Maisie Fus  Register On: 08/06/2016 09:38   Dg Hip Operative Unilat W Or W/o Pelvis Right  Addendum Date: 08/06/2016   ADDENDUM REPORT: 08/06/2016 09:52 ADDENDUM: No metallic foreign bodies noted. Electronically Signed   By: Maisie Fus  Register   On: 08/06/2016 09:52   Result Date: 08/06/2016 CLINICAL DATA:  Hip replacement. EXAM: OPERATIVE RIGHT HIP (WITH PELVIS IF PERFORMED) 5 VIEWS TECHNIQUE: Fluoroscopic spot image(s) were submitted for interpretation post-operatively. COMPARISON:  05/26/2016. FINDINGS: Total right hip replacement good anatomic alignment. Hardware intact. No acute bony abnormality. IMPRESSION: Total right hip replacement with good anatomic alignment. No acute abnormality. Electronically Signed: By: Maisie Fus  Register On: 08/06/2016 09:38     Disposition: 01-Home or Self Care  Discharge Instructions    Call MD / Call 911    Complete by:  As directed    If you experience chest pain or shortness of breath, CALL 911 and be transported to the hospital emergency room.  If you develope a fever above 101 F, pus (Bonini drainage) or increased drainage or redness at the wound, or calf pain, call your surgeon's office.   Constipation Prevention    Complete by:  As directed    Drink plenty of fluids.  Prune juice may be helpful.  You may use a stool softener, such as Colace (over the counter) 100 mg twice a day.  Use MiraLax (over the counter)  for constipation as needed.   Diet - low sodium heart healthy    Complete by:  As directed    Driving restrictions    Complete by:  As directed    No driving for 2 weeks   Follow the hip precautions as taught in Physical Therapy    Complete by:  As directed    Increase activity slowly as tolerated    Complete by:  As directed    Patient may shower    Complete by:  As directed    You may shower without a dressing once there is no drainage.  Do not wash over the wound.  If drainage remains, cover wound with plastic wrap and then shower.      Follow-up Information    [name], MD Follow up in 2 week(s).   Specialty:  Orthopedic Surgery Contact information: [address] [place] [address] [phone]            Signed: Henry Russel 08/08/2016, 7:56 AM

## 2016-10-30 ENCOUNTER — Encounter (HOSPITAL_COMMUNITY): Payer: Self-pay | Admitting: Emergency Medicine

## 2016-10-30 ENCOUNTER — Ambulatory Visit (INDEPENDENT_AMBULATORY_CARE_PROVIDER_SITE_OTHER): Payer: Medicaid Other

## 2016-10-30 ENCOUNTER — Ambulatory Visit (HOSPITAL_COMMUNITY)
Admission: EM | Admit: 2016-10-30 | Discharge: 2016-10-30 | Disposition: A | Discharge status: Home / Self Care | Payer: Medicaid Other | Attending: Internal Medicine | Admitting: Internal Medicine

## 2016-10-30 DIAGNOSIS — S300XXA Contusion of lower back and pelvis, initial encounter: Secondary | ICD-10-CM

## 2016-10-30 DIAGNOSIS — T148XXA Other injury of unspecified body region, initial encounter: Secondary | ICD-10-CM

## 2016-10-30 DIAGNOSIS — M25552 Pain in left hip: Secondary | ICD-10-CM

## 2016-10-30 DIAGNOSIS — W108XXA Fall (on) (from) other stairs and steps, initial encounter: Secondary | ICD-10-CM

## 2016-10-30 NOTE — ED Provider Notes (Signed)
CSN: [id number]     Arrival date & time 10/30/16  1821 History   First MD Initiated Contact with Patient 10/30/16 1856     Chief Complaint  Patient presents with  . Fall   (Consider location/radiation/quality/duration/timing/severity/associated sxs/prior Treatment) 36 year old obese female complaining of left hip pain after she was descending the steps, fell a couple steps after her left foot slipped and landed on her left buttock. She has a history of bilateral avascular necrosis of the hips as well as degenerative disc disease, polyarthralgia due to arthritis, depression, anxiety and posttraumatic stress disorder. She states she did not injure her head, neck, chest or back. She states the pain is primarily to the left posterior buttock, lateral hip and anterior left pelvis. She is able to ambulate with a crutch. Denies injury to the right hip which  received total hip arthro plasty in April.       Past Medical History:  Diagnosis Date  . Anxiety   . Arthritis    "hip, left knee, hands" (08/06/2016)  . Avascular necrosis (HCC)    "hips" (08/06/2016)  . Bipolar 1 disorder (HCC)   . Childhood asthma   . Chronic back pain    "all over my back" (08/06/2016)  . Chronic bronchitis (HCC)   . DDD (degenerative disc disease), cervical   . Depression   . Hip pain   . Pneumonia 2015  . PTSD (post-traumatic stress disorder) dx'd 2012  . Seizures (HCC)    "non epilleptic; not often; usually when they're changing my seizure RX around" (08/06/2016)   Past Surgical History:  Procedure Laterality Date  . DILATION AND CURETTAGE OF UTERUS    . JOINT REPLACEMENT    . TOTAL HIP ARTHROPLASTY Right 08/06/2016  . TOTAL HIP ARTHROPLASTY Right 08/06/2016   Procedure: TOTAL HIP ARTHROPLASTY ANTERIOR APPROACH;  Surgeon: Gean Birchwood, MD;  Location: [facility] OR;  Service: Orthopedics;  Laterality: Right;  . WISDOM TOOTH EXTRACTION     Family History  Problem Relation Age of Onset  . High blood pressure Mother    . Bipolar disorder Father   . Aneurysm Father   . Obesity Sister   . Breast cancer Sister   . Cervical cancer Sister   . Colon cancer Sister   . Bipolar disorder Sister   . Diabetes Other    Social History  Substance Use Topics  . Smoking status: Current Every Day Smoker    Packs/day: 0.75    Years: 18.00    Types: Cigarettes  . Smokeless tobacco: Never Used  . Alcohol use Yes     Comment: 08/06/2016 "drank some in college; nothing since 10/18/2015"   OB History    No data available     Review of Systems  Constitutional: Positive for activity change. Negative for fever.  HENT: Negative.   Respiratory: Negative.   Cardiovascular: Negative.   Musculoskeletal: Positive for arthralgias, gait problem and myalgias. Negative for joint swelling.       Complains of right anterior thigh muscle pain.  Skin: Negative.   All other systems reviewed and are negative.   Allergies  Latex; Zyprexa [olanzapine]; Gabapentin; Augmentin [amoxicillin-pot clavulanate]; and Tramadol  Home Medications   Prior to Admission medications   Medication Sig Start Date End Date Taking? Authorizing Provider  busPIRone (BUSPAR) 15 MG tablet Take 1 tablet (15 mg total) by mouth 3 (three) times daily. 06/24/13  Yes Withrow, Everardo All, FNP  lamoTRIgine (LAMICTAL) 150 MG tablet Take 300 mg by mouth every  morning.   Yes [provider]  tiZANidine (ZANAFLEX) 2 MG tablet Take 1 tablet (2 mg total) by mouth every 6 (six) hours as needed for muscle spasms. 08/06/16  Yes Allena KatzPhillips, Eric K, PA-C   Meds Ordered and Administered this Visit  Medications - No data to display  BP 122/74 (BP Location: Right Arm)   Pulse 92   Temp 98.5 F (36.9 C) (Oral)   Resp 18   SpO2 100%  No data found.   Physical Exam  Constitutional: She is oriented to person, place, and time. She appears well-developed and well-nourished. No distress.  HENT:  Head: Normocephalic and atraumatic.  Eyes: Pupils are equal, round, and  reactive to light. EOM are normal.  Neck: Normal range of motion. Neck supple.  Pulmonary/Chest: Effort normal.  Musculoskeletal:  Tenderness to the left posterior hip, upper mid and lower buttock, left lateral hip. Able to rotate left hip internal and external but range of motion limited due to pain. She states she has chronic pain in that hip and is unable to determine whether this pain existed prior to or just after the fall this afternoon. Flexion and extension of the hip is intact. Distal neurovascular motor Sentry is grossly intact.  Neurological: She is alert and oriented to person, place, and time. No cranial nerve deficit.  Skin: Skin is warm and dry.  Psychiatric: She has a normal mood and affect.  Nursing note and vitals reviewed.   Urgent Care Course     Procedures (including critical care time)  Labs Review Labs Reviewed - No data to display  Imaging Review Dg Hip Unilat With Pelvis 2-3 Views Left  Result Date: 10/30/2016 CLINICAL DATA:  Left hip pain after fall on steps. EXAM: DG HIP (WITH OR WITHOUT PELVIS) 2-3V LEFT COMPARISON:  05/26/2016 FINDINGS: There is a new uncemented right hip arthroplasty partially included. No malalignment is noted or evidence of hardware failure to the extent visualized. The sacroiliac joints and pubic symphysis appear intact without diastasis. The arcuate lines of the sacrum appear intact without fracture. The lower lumbar spine is unremarkable. The native left hip appears intact and unchanged from prior exam. No pelvic fracture is seen. There is no evidence of arthropathy or other focal bone abnormality. IMPRESSION: New right hip arthroplasty without evidence of dislocation or hardware failure. No acute osseous abnormality of the pelvis and native left hip. Electronically Signed   By: Tollie Ethavid  Kwon M.D.   On: 10/30/2016 20:01     Visual Acuity Review  Right Eye Distance:   Left Eye Distance:   Bilateral Distance:    Right Eye Near:   Left  Eye Near:    Bilateral Near:         MDM   1. Fall (on) (from) other stairs and steps, initial encounter   2. Contusion, buttock, initial encounter   3. Muscle strain    The x-ray showed no broken bones, dislocations, disruption of the right hip replacement. The left hip and pelvis are intact. The injury appears to be a contusion or bruise to the buttocks. Also you may have strained some muscle during the fall. Place ice to these areas. And take your medication for pain as needed or ibuprofen. He may continue to use the crutches long as you need to but this should not be very long since the bones are intact. Call your orthopedist or primary care provider if needed for problems.        Hayden RasmussenMabe, Denisse Whitenack, NP  10/30/16 2034  

## 2016-10-30 NOTE — Discharge Instructions (Signed)
The x-ray showed no broken bones, dislocations, disruption of the right hip replacement. The left hip and pelvis are intact. The injury appears to be a contusion or bruise to the buttocks. Also you may have strained some muscle during the fall. Place ice to these areas. And take your medication for pain as needed or ibuprofen. He may continue to use the crutches long as you need to but this should not be very long since the bones are intact. Call your orthopedist or primary care provider if needed for problems. Dg Hip Unilat With Pelvis 2-3 Views Left  Result Date: 10/30/2016 CLINICAL DATA:  Left hip pain after fall on steps. EXAM: DG HIP (WITH OR WITHOUT PELVIS) 2-3V LEFT COMPARISON:  05/26/2016 FINDINGS: There is a new uncemented right hip arthroplasty partially included. No malalignment is noted or evidence of hardware failure to the extent visualized. The sacroiliac joints and pubic symphysis appear intact without diastasis. The arcuate lines of the sacrum appear intact without fracture. The lower lumbar spine is unremarkable. The native left hip appears intact and unchanged from prior exam. No pelvic fracture is seen. There is no evidence of arthropathy or other focal bone abnormality. IMPRESSION: New right hip arthroplasty without evidence of dislocation or hardware failure. No acute osseous abnormality of the pelvis and native left hip. Electronically Signed   By: Tollie Ethavid  Kwon M.D.   On: 10/30/2016 20:01

## 2016-10-30 NOTE — ED Triage Notes (Addendum)
The patient presented to [facility] with a complaint of bilateral hip pain secondary to a fall that occurred earlier today. The patient reported a recent right hip replacement and a hx of avascular necrosis of the left.

## 2016-11-02 ENCOUNTER — Ambulatory Visit: Payer: Medicaid Other | Admitting: Physical Therapy

## 2016-11-19 ENCOUNTER — Other Ambulatory Visit: Payer: Self-pay | Admitting: Pediatrics

## 2016-11-19 ENCOUNTER — Other Ambulatory Visit: Payer: Self-pay | Admitting: Nurse Practitioner

## 2016-11-19 DIAGNOSIS — R5381 Other malaise: Secondary | ICD-10-CM

## 2016-12-25 ENCOUNTER — Emergency Department (HOSPITAL_COMMUNITY)
Admission: EM | Admit: 2016-12-25 | Discharge: 2016-12-25 | Disposition: A | Discharge status: Home / Self Care | Payer: Medicaid Other | Attending: Emergency Medicine | Admitting: Emergency Medicine

## 2016-12-25 ENCOUNTER — Encounter (HOSPITAL_COMMUNITY): Payer: Self-pay | Admitting: Emergency Medicine

## 2016-12-25 ENCOUNTER — Emergency Department (HOSPITAL_COMMUNITY): Payer: Medicaid Other

## 2016-12-25 DIAGNOSIS — Y929 Unspecified place or not applicable: Secondary | ICD-10-CM | POA: Diagnosis not present

## 2016-12-25 DIAGNOSIS — M25552 Pain in left hip: Secondary | ICD-10-CM | POA: Diagnosis present

## 2016-12-25 DIAGNOSIS — Y939 Activity, unspecified: Secondary | ICD-10-CM | POA: Diagnosis not present

## 2016-12-25 DIAGNOSIS — W109XXA Fall (on) (from) unspecified stairs and steps, initial encounter: Secondary | ICD-10-CM | POA: Insufficient documentation

## 2016-12-25 DIAGNOSIS — Z96641 Presence of right artificial hip joint: Secondary | ICD-10-CM | POA: Diagnosis not present

## 2016-12-25 DIAGNOSIS — W19XXXA Unspecified fall, initial encounter: Secondary | ICD-10-CM

## 2016-12-25 DIAGNOSIS — Y999 Unspecified external cause status: Secondary | ICD-10-CM | POA: Insufficient documentation

## 2016-12-25 DIAGNOSIS — F17210 Nicotine dependence, cigarettes, uncomplicated: Secondary | ICD-10-CM | POA: Diagnosis not present

## 2016-12-25 DIAGNOSIS — M25562 Pain in left knee: Secondary | ICD-10-CM | POA: Diagnosis not present

## 2016-12-25 DIAGNOSIS — Z79899 Other long term (current) drug therapy: Secondary | ICD-10-CM | POA: Insufficient documentation

## 2016-12-25 MED ORDER — CYCLOBENZAPRINE HCL 10 MG PO TABS: 10.0000 mg | ORAL_TABLET | Freq: Every evening | ORAL | 0 refills | Status: DC | PRN

## 2016-12-25 MED ORDER — KETOROLAC TROMETHAMINE 30 MG/ML IJ SOLN
15.0000 mg | Freq: Once | INTRAMUSCULAR | Status: AC
  Administered 2016-12-25: 15 mg via INTRAMUSCULAR
  Filled 2016-12-25: qty 1

## 2016-12-25 MED ORDER — KETOROLAC TROMETHAMINE 10 MG PO TABS: 10.0000 mg | ORAL_TABLET | Freq: Four times a day (QID) | ORAL | 0 refills | Status: DC | PRN

## 2016-12-25 NOTE — Discharge Instructions (Signed)
Please read instructions below. Apply ice to your knee and hip for 20 minutes at a time. You can take toradol every 6 hours as needed for pain. Schedule an appointment with your orthopedic specialist to follow-up. Return to the ER for new or concerning symptoms.

## 2016-12-25 NOTE — ED Triage Notes (Signed)
Patient states that she has vascular necrosis in hips and had replacement of right hip and when left hip is worse, she is going to have hip replacement on it.  Patient reports that yesterday going down stairs and fell, patient tried catching herself so wouldn't mess up right hip, patient reports that left hip pain is worse as well as pain in right hip and left knee.  Patient has referral consult but not until October.

## 2016-12-25 NOTE — ED Provider Notes (Signed)
[facility]-EMERGENCY DEPT Provider Note   CSN: [id number] Arrival date & time: 12/25/16  4098     History   Chief Complaint Chief Complaint  Patient presents with  . Hip Pain  . Knee Pain    HPI Theresa Mckay is a 36 y.o. 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 She states she was trying to avoid falling onto her R hip since she just has replacement in April. She states this caused her to fall towards her right hip and she has been having posterior hip pain that is aching since. She states pain in knee is also aching. Both are made worse with ambulating and weight bearing. She denies new numbness or tingling in extremities, head trauma or LOC, new back pain, fever, or new or concerning symptoms. She states she took tramadol without much relief.  The history is provided by the patient.    Past Medical History:  Diagnosis Date  . Anxiety   . Arthritis    "hip, left knee, hands" (08/06/2016)  . Avascular necrosis (HCC)    "hips" (08/06/2016)  . Bipolar 1 disorder (HCC)   . Childhood asthma   . Chronic back pain    "all over my back" (08/06/2016)  . Chronic bronchitis (HCC)   . DDD (degenerative disc disease), cervical   . Depression   . Hip pain   . Pneumonia 2015  . PTSD (post-traumatic stress disorder) dx'd 2012  . Seizures (HCC)    "non epilleptic; not often; usually when they're changing my seizure RX around" (08/06/2016)    Patient Active Problem List   Diagnosis Date Noted  . Arthritis of right hip 08/06/2016  . Avascular necrosis of hip, right (HCC) 08/01/2016  . Chronic posttraumatic stress disorder 06/19/2013  . MDD (major depressive disorder) 06/18/2013  . MDD (major depressive disorder), recurrent episode, severe (HCC) 06/17/2013  . Homicidal ideation 06/17/2013    Past Surgical History:    Procedure Laterality Date  . DILATION AND CURETTAGE OF UTERUS    . JOINT REPLACEMENT    . TOTAL HIP ARTHROPLASTY Right 08/06/2016  . TOTAL HIP ARTHROPLASTY Right 08/06/2016   Procedure: TOTAL HIP ARTHROPLASTY ANTERIOR APPROACH;  Surgeon: Gean Birchwood, MD;  Location: [facility] OR;  Service: Orthopedics;  Laterality: Right;  . WISDOM TOOTH EXTRACTION      OB History    No data available       Home Medications    Prior to Admission medications   Medication Sig Start Date End Date Taking? Authorizing Provider  ARIPiprazole (ABILIFY) 5 MG tablet Take 5 mg by mouth every evening.    Yes [provider]  busPIRone (BUSPAR) 15 MG tablet Take 1 tablet (15 mg total) by mouth 3 (three) times daily. 06/24/13  Yes Withrow, Everardo All, FNP  diphenhydrAMINE (BENADRYL) 25 MG tablet Take 25 mg by mouth every 6 (six) hours as needed for allergies.   Yes [provider]  ibuprofen (ADVIL,MOTRIN) 200 MG tablet Take 600-800 mg by mouth 3 (three) times daily.  at morning and afternoon,  at bedtime   Yes [provider]  lamoTRIgine (LAMICTAL) 150 MG tablet Take 300 mg by mouth daily. 02/01/15  Yes [provider]  metFORMIN (GLUCOPHAGE) 500 MG tablet Take 500 mg by mouth 2 (two) times daily. 11/22/16  Yes  [provider]  cyclobenzaprine (FLEXERIL) 10 MG tablet Take 1 tablet (10 mg total) by mouth at bedtime as needed for muscle spasms. 12/25/16   Russo, [place] N, PA-C  ketorolac (TORADOL) 10 MG tablet Take 1 tablet (10 mg total) by mouth every 6 (six) hours as needed. 12/25/16   Russo, [place] N, PA-C  tiZANidine (ZANAFLEX) 2 MG tablet Take 1 tablet (2 mg total) by mouth every 6 (six) hours as needed for muscle spasms. Patient not taking: Reported on 12/25/2016 08/06/16   Allena KatzPhillips, Eric K, PA-C    Family History Family History  Problem Relation Age of Onset  . High blood pressure Mother   . Bipolar disorder Father   . Aneurysm Father   . Obesity Sister   . Breast  cancer Sister   . Cervical cancer Sister   . Colon cancer Sister   . Bipolar disorder Sister   . Diabetes Other     Social History Social History  Substance Use Topics  . Smoking status: Current Every Day Smoker    Packs/day: 0.75    Years: 18.00    Types: Cigarettes  . Smokeless tobacco: Never Used  . Alcohol use Yes     Comment: 08/06/2016 "drank some in college; nothing since 10/18/2015"     Allergies   Latex; Zyprexa [olanzapine]; Gabapentin; Augmentin [amoxicillin-pot clavulanate]; and Tramadol   Review of Systems Review of Systems  Constitutional: Negative for chills and fever.  Musculoskeletal: Positive for arthralgias.  Skin: Negative for wound.  Neurological: Negative for headaches.  All other systems reviewed and are negative.    Physical Exam Updated Vital Signs BP 112/82 (BP Location: Left Arm)   Pulse 90   Temp 98.9 F (37.2 C) (Oral)   Resp 16   Ht 5\' 6"  (1.676 m)   Wt 100.7 kg (222 lb)   LMP 12/24/2016 (Approximate)   SpO2 100%   BMI 35.83 kg/m   Physical Exam  Constitutional: She appears well-developed and well-nourished. No distress.  Well-appearing  HENT:  Head: Normocephalic and atraumatic.  Eyes: Conjunctivae are normal.  Neck: Normal range of motion.  Cardiovascular: Normal rate, regular rhythm, normal heart sounds and intact distal pulses.  Exam reveals no friction rub.   No murmur heard. Pulmonary/Chest: Effort normal and breath sounds normal. No respiratory distress. She has no wheezes. She has no rales.  Abdominal: Soft.  Musculoskeletal:  Left leg and hip without deformity, edema, or ecchymosis. Left hip with normla internal and external rotation, with mild pain with motion. Normal flexion and extension of hip. Left knee with mild posterior tenderness. Neg anterior/posterior drawer, negative valgus and varus. Normal ROM.  Neurological: She is alert.  Skin: Skin is warm.  Psychiatric: She has a normal mood and affect. Her behavior  is normal.  Nursing note and vitals reviewed.    ED Treatments / Results  Labs (all labs ordered are listed, but only abnormal results are displayed) Labs Reviewed - No data to display  EKG  EKG Interpretation None       Radiology Dg Knee 2 Views Left  Result Date: 12/25/2016 CLINICAL DATA:  Left knee pain, fall EXAM: LEFT KNEE - 1-2 VIEW COMPARISON:  None. FINDINGS: No acute bony abnormality. Specifically, no fracture, subluxation, or dislocation. Soft tissues are intact. No joint effusion IMPRESSION: No acute bony abnormality. Electronically Signed   By: Charlett NoseKevin  Dover M.D.   On: 12/25/2016 09:18   Dg Hip Unilat With Pelvis 2-3 Views Left  Result Date: 12/25/2016  CLINICAL DATA:  Chronic left hip pain.  No injury.  Fall. EXAM: DG HIP (WITH OR WITHOUT PELVIS) 2-3V LEFT COMPARISON:  10/30/2016 FINDINGS: Prior right hip replacement. No hardware complicating feature. Left hip joint is maintained. SI joints are symmetric and unremarkable. No acute bony abnormality. Specifically, no fracture, subluxation, or dislocation. Soft tissues are intact. IMPRESSION: No acute bony abnormality.  Prior right hip replacement. Electronically Signed   By: Charlett Nose M.D.   On: 12/25/2016 09:18    Procedures Procedures (including critical care time)  Medications Ordered in ED Medications  ketorolac (TORADOL) 30 MG/ML injection 15 mg (15 mg Intramuscular Given 12/25/16 1131)     Initial Impression / Assessment and Plan / ED Course  I have reviewed the triage vital signs and the nursing notes.  Pertinent labs & imaging results that were available during my care of the patient were reviewed by me and considered in my medical decision making (see chart for details).     Patient present with acute on chronic left hip and knee pain status post mechanical fall down 2 steps that occurred yesterday. Presenting with concern for injury to left hip as she has been preceded diagnosed with AVN. Left  hip/pelvis x-ray negative for acute pathology, with stable right hip replacement. Left knee x-ray negative. Exam reassuring. NV intact. Pain managed in ED with Toradol. Will send with symptomatic management recommend patient to follow-up with her orthopedic specialist. Patient taking tramadol, instructed her to discontinue taking when taking with prescribed medication.  Discussed results, findings, treatment and follow up. Patient advised of return precautions. Patient verbalized understanding and agreed with plan.   Final Clinical Impressions(s) / ED Diagnoses   Final diagnoses:  Fall, initial encounter  Left hip pain    New Prescriptions Discharge Medication List as of 12/25/2016 11:41 AM    START taking these medications   Details  ketorolac (TORADOL) 10 MG tablet Take 1 tablet (10 mg total) by mouth every 6 (six) hours as needed., Starting Tue 12/25/2016, Print         Timothy Lasso, [place] N, PA-C 12/25/16 1442    Alvira Monday, MD 12/26/16 1216

## 2017-01-15 ENCOUNTER — Encounter (HOSPITAL_COMMUNITY): Payer: Self-pay | Admitting: Emergency Medicine

## 2017-01-15 ENCOUNTER — Ambulatory Visit (HOSPITAL_COMMUNITY)
Admission: EM | Admit: 2017-01-15 | Discharge: 2017-01-15 | Disposition: A | Discharge status: Home / Self Care | Payer: Medicaid Other | Attending: Family Medicine | Admitting: Family Medicine

## 2017-01-15 DIAGNOSIS — M25551 Pain in right hip: Secondary | ICD-10-CM | POA: Diagnosis not present

## 2017-01-15 MED ORDER — KETOROLAC TROMETHAMINE 60 MG/2ML IM SOLN
INTRAMUSCULAR | Status: AC
  Filled 2017-01-15: qty 2

## 2017-01-15 MED ORDER — KETOROLAC TROMETHAMINE 60 MG/2ML IM SOLN
60.0000 mg | Freq: Once | INTRAMUSCULAR | Status: AC
  Administered 2017-01-15: 60 mg via INTRAMUSCULAR

## 2017-01-15 NOTE — ED Triage Notes (Signed)
Pt here for persistent hip pain on left side onset 1 month +++   Reports hip replacement on 08/06/16  Sts she went to [facility] ED on 9/11 for similar sx  Believes the cold weather may be contributing to pain  Needing note for work b/c she has called out due to pain  A&O x4... NAD... Ambulatory

## 2017-01-16 NOTE — ED Provider Notes (Signed)
[facility]ARE CENTER   [id number] 01/15/17 Arrival Time: 1855  ASSESSMENT & PLAN:  1. Right hip pain     Meds ordered this encounter  Medications  . ketorolac (TORADOL) injection 60 mg   May f/u as needed. If discomfort continues, she should f/u with her orthopaedist. Reviewed expectations re: course of current medical issues. Questions answered. Outlined signs and symptoms indicating need for more acute intervention. Patient verbalized understanding. After Visit Summary given.   SUBJECTIVE:  Theresa Mckay is a 36 y.o. female who presents with complaint of R hip pain. H/O hip replacement. Last time this flared she reports IM Toradol helped significantly. Requests again. Ambulatory. No specific aggravating or alleviating factors reported. No extremity sensation changes or weakness. OTC ibuprofen with only mild help. No recent injury or trauma.  ROS: As per HPI.   OBJECTIVE:  Vitals:   01/15/17 1921  BP: 119/75  Pulse: (!) 102  Resp: 20  Temp: 98.4 F (36.9 C)  TempSrc: Oral  SpO2: 99%    General appearance: alert; no distress Extremities: vague tenderness over lateral R hip; FROM; normal strength Skin: warm and dry Neurologic: normal gait; normal symmetric reflexes Psychological: alert and cooperative; normal mood and affect   Allergies  Allergen Reactions  . Latex     Hives, swelling  . Zyprexa [Olanzapine]     Becomes psychotic  . Gabapentin     Increased BP  . Augmentin [Amoxicillin-Pot Clavulanate] Diarrhea and Nausea And Vomiting  . Tramadol     rash    Past Medical History:  Diagnosis Date  . Anxiety   . Arthritis    "hip, left knee, hands" (08/06/2016)  . Avascular necrosis (HCC)    "hips" (08/06/2016)  . Bipolar 1 disorder (HCC)   . Childhood asthma   . Chronic back pain    "all over my back" (08/06/2016)  . Chronic bronchitis (HCC)   . DDD (degenerative disc disease), cervical   . Depression   . Hip pain   . Pneumonia 2015  . PTSD  (post-traumatic stress disorder) dx'd 2012  . Seizures (HCC)    "non epilleptic; not often; usually when they're changing my seizure RX around" (08/06/2016)   Social History   Social History  . Marital status: Single    Spouse name: N/A  . Number of children: N/A  . Years of education: N/A   Occupational History  . Not on file.   Social History Main Topics  . Smoking status: Current Every Day Smoker    Packs/day: 0.75    Years: 18.00    Types: Cigarettes  . Smokeless tobacco: Never Used  . Alcohol use Yes     Comment: 08/06/2016 "drank some in college; nothing since 10/18/2015"  . Drug use: No  . Sexual activity: Not Currently   Other Topics Concern  . Not on file   Social History Narrative  . No narrative on file   Family History  Problem Relation Age of Onset  . High blood pressure Mother   . Bipolar disorder Father   . Aneurysm Father   . Obesity Sister   . Breast cancer Sister   . Cervical cancer Sister   . Colon cancer Sister   . Bipolar disorder Sister   . Diabetes Other    Past Surgical History:  Procedure Laterality Date  . DILATION AND CURETTAGE OF UTERUS    . JOINT REPLACEMENT    . TOTAL HIP ARTHROPLASTY Right 08/06/2016  . TOTAL HIP ARTHROPLASTY Right  08/06/2016   Procedure: TOTAL HIP ARTHROPLASTY ANTERIOR APPROACH;  Surgeon: Gean Birchwood, MD;  Location: [facility] OR;  Service: Orthopedics;  Laterality: Right;  . WISDOM TOOTH EXTRACTION       Mardella Layman, MD 01/16/17 [phone]

## 2017-01-24 ENCOUNTER — Encounter: Payer: Self-pay | Admitting: Nurse Practitioner

## 2017-03-18 ENCOUNTER — Ambulatory Visit (HOSPITAL_COMMUNITY)
Admission: EM | Admit: 2017-03-18 | Discharge: 2017-03-18 | Disposition: A | Discharge status: Home / Self Care | Payer: Medicaid Other | Attending: Urgent Care | Admitting: Urgent Care

## 2017-03-18 ENCOUNTER — Other Ambulatory Visit: Payer: Self-pay

## 2017-03-18 ENCOUNTER — Ambulatory Visit (INDEPENDENT_AMBULATORY_CARE_PROVIDER_SITE_OTHER): Payer: Medicaid Other

## 2017-03-18 ENCOUNTER — Encounter (HOSPITAL_COMMUNITY): Payer: Self-pay | Admitting: Emergency Medicine

## 2017-03-18 DIAGNOSIS — S39012A Strain of muscle, fascia and tendon of lower back, initial encounter: Secondary | ICD-10-CM

## 2017-03-18 DIAGNOSIS — M545 Low back pain, unspecified: Secondary | ICD-10-CM

## 2017-03-18 MED ORDER — KETOROLAC TROMETHAMINE 60 MG/2ML IM SOLN
60.0000 mg | Freq: Once | INTRAMUSCULAR | Status: AC
  Administered 2017-03-18: 60 mg via INTRAMUSCULAR

## 2017-03-18 MED ORDER — KETOROLAC TROMETHAMINE 30 MG/ML IJ SOLN
INTRAMUSCULAR | Status: AC
  Filled 2017-03-18: qty 1

## 2017-03-18 MED ORDER — CYCLOBENZAPRINE HCL 10 MG PO TABS: 10.0000 mg | ORAL_TABLET | Freq: Every evening | ORAL | 0 refills | Status: DC | PRN

## 2017-03-18 MED ORDER — KETOROLAC TROMETHAMINE 10 MG PO TABS: 10.0000 mg | ORAL_TABLET | Freq: Four times a day (QID) | ORAL | 0 refills | Status: DC | PRN

## 2017-03-18 NOTE — Discharge Instructions (Addendum)
Please take Flexeril at night only. You may take Toradol for pain and inflammation once every 6 hours.

## 2017-03-18 NOTE — ED Triage Notes (Signed)
Patient bent foreard this morning to pick something up and started having back pain immediately.

## 2017-03-18 NOTE — ED Provider Notes (Signed)
  MRN: [id number] DOB: 09/17/1980  Subjective:   Theresa BondsKristina Mccance is a 36 y.o. female presenting for sudden onset of deep achy constant low back pain that developed after bending down and coming back up. Pain was initially very sharp and radiated upward but is now bothering patient's right hip. Denies trauma, fall, lifting heavy item. She works with [employer], is very active with her job. Has a history of AVN of right hip, s/p right hip replacement.  Foye ClockKristina is allergic to latex; zyprexa [olanzapine]; gabapentin; augmentin [amoxicillin-pot clavulanate]; and tramadol.  Foye ClockKristina  has a past medical history of Anxiety, Arthritis, Avascular necrosis (HCC), Bipolar 1 disorder (HCC), Childhood asthma, Chronic back pain, Chronic bronchitis (HCC), DDD (degenerative disc disease), cervical, Depression, Hip pain, Pneumonia (2015), PTSD (post-traumatic stress disorder) (dx'd 2012), and Seizures (HCC). Also  has a past surgical history that includes Dilation and curettage of uterus; Wisdom tooth extraction; Joint replacement; Total hip arthroplasty (Right, 08/06/2016); and Total hip arthroplasty (Right, 08/06/2016).  Objective:   Vitals: BP 112/78 (BP Location: Right Arm) Comment (BP Location): large cuff  Pulse 91   Temp 98.3 F (36.8 C) (Oral)   Resp 20   LMP 02/27/2017   SpO2 100%   Physical Exam  Constitutional: She is oriented to person, place, and time. She appears well-developed and well-nourished.  Cardiovascular: Normal rate.  Pulmonary/Chest: Effort normal.  Musculoskeletal:       Lumbar back: She exhibits decreased range of motion and tenderness (over area depicted). She exhibits no bony tenderness, no swelling, no edema, no deformity and no spasm.       Back:  Negative SLR.  Neurological: She is alert and oriented to person, place, and time. She displays normal reflexes. Coordination (favoring low back and moving gingerly from seated to rising position) abnormal.  Skin: Skin is warm and dry.    Psychiatric: She has a normal mood and affect.   Dg Lumbar Spine Complete  Result Date: 03/18/2017 CLINICAL DATA:  Severe low back pain EXAM: LUMBAR SPINE - COMPLETE 4+ VIEW COMPARISON:  None. FINDINGS: There is no evidence of lumbar spine fracture. Alignment is normal. Intervertebral disc spaces are maintained. IMPRESSION: Negative. Electronically Signed   By: Elige KoHetal  Patel   On: 03/18/2017 15:51   Assessment and Plan :   Strain of lumbar region, initial encounter  Acute bilateral low back pain without sciatica  Will manage conservatively with NSAID and muscle relaxant. Return-to-clinic precautions discussed, patient verbalized understanding.   Wallis BambergMario Maja Mccaffery, PA-C [facility]  03/18/2017  3:23 PM    Wallis BambergMani, Ardyth Kelso, PA-C 03/18/17 1609

## 2017-10-23 ENCOUNTER — Ambulatory Visit (HOSPITAL_COMMUNITY)
Admission: EM | Admit: 2017-10-23 | Discharge: 2017-10-23 | Disposition: A | Discharge status: Home / Self Care | Payer: Medicaid Other | Attending: Family Medicine | Admitting: Family Medicine

## 2017-10-23 ENCOUNTER — Other Ambulatory Visit: Payer: Self-pay

## 2017-10-23 ENCOUNTER — Encounter (HOSPITAL_COMMUNITY): Payer: Self-pay

## 2017-10-23 DIAGNOSIS — G40909 Epilepsy, unspecified, not intractable, without status epilepticus: Secondary | ICD-10-CM | POA: Diagnosis not present

## 2017-10-23 DIAGNOSIS — Z760 Encounter for issue of repeat prescription: Secondary | ICD-10-CM

## 2017-10-23 MED ORDER — LAMOTRIGINE 150 MG PO TABS: 300.0000 mg | ORAL_TABLET | Freq: Every day | ORAL | 0 refills | Status: DC

## 2017-10-23 NOTE — ED Provider Notes (Signed)
[facility]   [id number] 10/23/17 Arrival Time: 1750  ASSESSMENT & PLAN:  1. Medication refill     Meds ordered this encounter  Medications  . lamoTRIgine (LAMICTAL) 150 MG tablet    Sig: Take 2 tablets (300 mg total) by mouth daily.    Dispense:  60 tablet    Refill:  0   Will arrange f/u with PCP for further refills.  Reviewed expectations re: course of current medical issues. Questions answered. Outlined signs and symptoms indicating need for more acute intervention. Patient verbalized understanding. After Visit Summary given.   SUBJECTIVE: History from: patient. Bassheva Flury is a 37 y.o. female who presents requesting medication refill; lamotrigine. Has been on for a long time. Ran out this morning. No current concerns.  Current medical problems include:  Past Medical History:  Diagnosis Date  . Anxiety   . Arthritis    "hip, left knee, hands" (08/06/2016)  . Avascular necrosis (HCC)    "hips" (08/06/2016)  . Bipolar 1 disorder (HCC)   . Childhood asthma   . Chronic back pain    "all over my back" (08/06/2016)  . Chronic bronchitis (HCC)   . DDD (degenerative disc disease), cervical   . Depression   . Hip pain   . Pneumonia 2015  . PTSD (post-traumatic stress disorder) dx'd 2012  . Seizures (HCC)    "non epilleptic; not often; usually when they're changing my seizure RX around" (08/06/2016)   No current facility-administered medications for this encounter.   Current Outpatient Medications:  .  ARIPiprazole (ABILIFY) 5 MG tablet, Take 5 mg by mouth every evening. , Disp: , Rfl:  .  busPIRone (BUSPAR) 15 MG tablet, Take 1 tablet (15 mg total) by mouth 3 (three) times daily., Disp: 90 tablet, Rfl: 0 .  cyclobenzaprine (FLEXERIL) 10 MG tablet, Take 1 tablet (10 mg total) by mouth at bedtime as needed for muscle spasms., Disp: 30 tablet, Rfl: 0 .  diphenhydrAMINE (BENADRYL) 25 MG tablet, Take 25 mg by mouth every 6 (six) hours as needed for allergies.,  Disp: , Rfl:  .  ibuprofen (ADVIL,MOTRIN) 200 MG tablet, Take 600-800 mg by mouth 3 (three) times daily. 600mg  at morning and afternoon, 800mg  at bedtime, Disp: , Rfl:  .  ketorolac (TORADOL) 10 MG tablet, Take 1 tablet (10 mg total) by mouth every 6 (six) hours as needed., Disp: 30 tablet, Rfl: 0 .  lamoTRIgine (LAMICTAL) 150 MG tablet, Take 2 tablets (300 mg total) by mouth daily., Disp: 60 tablet, Rfl: 0 .  metFORMIN (GLUCOPHAGE) 500 MG tablet, Take 500 mg by mouth 2 (two) times daily., Disp: , Rfl:  .  tiZANidine (ZANAFLEX) 2 MG tablet, Take 1 tablet (2 mg total) by mouth every 6 (six) hours as needed for muscle spasms., Disp: 60 tablet, Rfl: 0  ROS: As per HPI.   OBJECTIVE:  Vitals:   10/23/17 1818  BP: 100/66  Pulse: 76  Resp: 17  Temp: 98.3 F (36.8 C)  TempSrc: Oral  SpO2: 96%    General appearance: alert; no distress Eyes: PERRLA; EOMI; conjunctiva normal Neck: supple  Lungs: clear to auscultation bilaterally Heart: regular rate and rhythm Extremities: no edema; symmetrical with no gross deformities Skin: warm and dry Neurologic: normal gait; normal symmetric reflexes Psychological: alert and cooperative; normal mood and affect  Allergies  Allergen Reactions  . Latex     Hives, swelling  . Zyprexa [Olanzapine]     Becomes psychotic  . Gabapentin  Increased BP  . Augmentin [Amoxicillin-Pot Clavulanate] Diarrhea and Nausea And Vomiting  . Tramadol     rash    Past Medical History:  Diagnosis Date  . Anxiety   . Arthritis    "hip, left knee, hands" (08/06/2016)  . Avascular necrosis (HCC)    "hips" (08/06/2016)  . Bipolar 1 disorder (HCC)   . Childhood asthma   . Chronic back pain    "all over my back" (08/06/2016)  . Chronic bronchitis (HCC)   . DDD (degenerative disc disease), cervical   . Depression   . Hip pain   . Pneumonia 2015  . PTSD (post-traumatic stress disorder) dx'd 2012  . Seizures (HCC)    "non epilleptic; not often; usually when  they're changing my seizure RX around" (08/06/2016)   Social History   Socioeconomic History  . Marital status: Single    Spouse name: Not on file  . Number of children: Not on file  . Years of education: Not on file  . Highest education level: Not on file  Occupational History  . Not on file  Social Needs  . Financial resource strain: Not on file  . Food insecurity:    Worry: Not on file    Inability: Not on file  . Transportation needs:    Medical: Not on file    Non-medical: Not on file  Tobacco Use  . Smoking status: Current Every Day Smoker    Packs/day: 0.75    Years: 18.00    Pack years: 13.50    Types: Cigarettes  . Smokeless tobacco: Never Used  Substance and Sexual Activity  . Alcohol use: Yes    Comment: 08/06/2016 "drank some in college; nothing since 10/18/2015"  . Drug use: No  . Sexual activity: Not Currently  Lifestyle  . Physical activity:    Days per week: Not on file    Minutes per session: Not on file  . Stress: Not on file  Relationships  . Social connections:    Talks on phone: Not on file    Gets together: Not on file    Attends religious service: Not on file    Active member of club or organization: Not on file    Attends meetings of clubs or organizations: Not on file    Relationship status: Not on file  . Intimate partner violence:    Fear of current or ex partner: Not on file    Emotionally abused: Not on file    Physically abused: Not on file    Forced sexual activity: Not on file  Other Topics Concern  . Not on file  Social History Narrative  . Not on file   Family History  Problem Relation Age of Onset  . High blood pressure Mother   . Bipolar disorder Father   . Aneurysm Father   . Obesity Sister   . Breast cancer Sister   . Cervical cancer Sister   . Colon cancer Sister   . Bipolar disorder Sister   . Diabetes Other    Past Surgical History:  Procedure Laterality Date  . DILATION AND CURETTAGE OF UTERUS    . JOINT  REPLACEMENT    . TOTAL HIP ARTHROPLASTY Right 08/06/2016  . TOTAL HIP ARTHROPLASTY Right 08/06/2016   Procedure: TOTAL HIP ARTHROPLASTY ANTERIOR APPROACH;  Surgeon: Gean BirchwoodFrank Rowan, MD;  Location: [facility] OR;  Service: Orthopedics;  Laterality: Right;  . WISDOM TOOTH EXTRACTION       Mardella LaymanHagler, Tamisha Nordstrom, MD 10/24/17 1040

## 2017-10-23 NOTE — ED Triage Notes (Signed)
[facility] Lam[facility]al 300 mg daily, pt has taken last dose this morning, primary care physician is out of office per pt.

## 2018-01-30 ENCOUNTER — Ambulatory Visit (HOSPITAL_COMMUNITY)
Admission: EM | Admit: 2018-01-30 | Discharge: 2018-01-30 | Disposition: A | Discharge status: Home / Self Care | Payer: Medicaid Other | Attending: Family Medicine | Admitting: Family Medicine

## 2018-01-30 ENCOUNTER — Encounter (HOSPITAL_COMMUNITY): Payer: Self-pay | Admitting: Emergency Medicine

## 2018-01-30 DIAGNOSIS — J441 Chronic obstructive pulmonary disease with (acute) exacerbation: Secondary | ICD-10-CM | POA: Diagnosis not present

## 2018-01-30 MED ORDER — ALBUTEROL SULFATE HFA 108 (90 BASE) MCG/ACT IN AERS
2.0000 | INHALATION_SPRAY | Freq: Once | RESPIRATORY_TRACT | Status: AC
  Administered 2018-01-30: 2 via RESPIRATORY_TRACT

## 2018-01-30 MED ORDER — ALBUTEROL SULFATE HFA 108 (90 BASE) MCG/ACT IN AERS
INHALATION_SPRAY | RESPIRATORY_TRACT | Status: AC
  Filled 2018-01-30: qty 6.7

## 2018-01-30 MED ORDER — AZITHROMYCIN 250 MG PO TABS: 250.0000 mg | ORAL_TABLET | Freq: Every day | ORAL | 0 refills | Status: DC

## 2018-01-30 NOTE — ED Triage Notes (Signed)
Pt c/o cold symptoms and chest congestion x1 week. Hx of asthma, does not have inhaler.

## 2018-01-30 NOTE — Discharge Instructions (Signed)
Declines chest x-ray today Inhaler given in office use as needed for shortness of breath or wheezing Get plenty of rest and push fluids Use OTC medication as needed for symptomatic relief Take antibiotic as directed and to completion Follow up with PCP if symptoms persist Return or go to ER if you have any new or worsening symptoms

## 2018-01-30 NOTE — ED Provider Notes (Signed)
[facility]   [id number] 01/30/18 Arrival Time: 1253  CC: Cough  SUBJECTIVE:  Theresa Mckay is a 37 y.o. female who presents with persistent cough x 1 week.  Admits positive sick exposure to co-worker with similar symptoms.  Also admits to smoking 1 PPD of cigarettes x approximately 20 years.  Describes cough as constant and productive with green sputum.  Has tried sudefed with mild relief.  Symptoms are made worse with lying down at night.  Reports previous symptoms in the past. Complains of fever with tmax of 101 last night, nasal congestion, rhinorrhea, SOB, wheezing, chest congestion, and 1 episode of post-tussive NB/NB emesis.   Denies sinus pain, sore throat, chest pain, nausea, changes in bowel or bladder habits.    ROS: As per HPI.  Past Medical History:  Diagnosis Date  . Anxiety   . Arthritis    "hip, left knee, hands" (08/06/2016)  . Avascular necrosis (HCC)    "hips" (08/06/2016)  . Bipolar 1 disorder (HCC)   . Childhood asthma   . Chronic back pain    "all over my back" (08/06/2016)  . Chronic bronchitis (HCC)   . DDD (degenerative disc disease), cervical   . Depression   . Hip pain   . Pneumonia 2015  . PTSD (post-traumatic stress disorder) dx'd 2012  . Seizures (HCC)    "non epilleptic; not often; usually when they're changing my seizure RX around" (08/06/2016)   Past Surgical History:  Procedure Laterality Date  . DILATION AND CURETTAGE OF UTERUS    . JOINT REPLACEMENT    . TOTAL HIP ARTHROPLASTY Right 08/06/2016  . TOTAL HIP ARTHROPLASTY Right 08/06/2016   Procedure: TOTAL HIP ARTHROPLASTY ANTERIOR APPROACH;  Surgeon: Gean Birchwood, MD;  Location: [facility] OR;  Service: Orthopedics;  Laterality: Right;  . WISDOM TOOTH EXTRACTION     Allergies  Allergen Reactions  . Latex     Hives, swelling  . Zyprexa [Olanzapine]     Becomes psychotic  . Gabapentin     Increased BP  . Augmentin [Amoxicillin-Pot Clavulanate] Diarrhea and Nausea And Vomiting  .  Tramadol     rash   No current facility-administered medications on file prior to encounter.    Current Outpatient Medications on File Prior to Encounter  Medication Sig Dispense Refill  . ARIPiprazole (ABILIFY) 5 MG tablet Take 5 mg by mouth every evening.     . diphenhydrAMINE (BENADRYL) 25 MG tablet Take 25 mg by mouth every 6 (six) hours as needed for allergies.    Marland Kitchen ibuprofen (ADVIL,MOTRIN) 200 MG tablet Take 600-800 mg by mouth 3 (three) times daily. 600mg  at morning and afternoon, 800mg  at bedtime    . lamoTRIgine (LAMICTAL) 150 MG tablet Take 2 tablets (300 mg total) by mouth daily. 60 tablet 0  . metFORMIN (GLUCOPHAGE) 500 MG tablet Take 500 mg by mouth 2 (two) times daily.      Social History   Socioeconomic History  . Marital status: Single    Spouse name: Not on file  . Number of children: Not on file  . Years of education: Not on file  . Highest education level: Not on file  Occupational History  . Not on file  Social Needs  . Financial resource strain: Not on file  . Food insecurity:    Worry: Not on file    Inability: Not on file  . Transportation needs:    Medical: Not on file    Non-medical: Not on file  Tobacco Use  .  Smoking status: Current Every Day Smoker    Packs/day: 0.75    Years: 18.00    Pack years: 13.50    Types: Cigarettes  . Smokeless tobacco: Never Used  Substance and Sexual Activity  . Alcohol use: Yes    Comment: 08/06/2016 "drank some in college; nothing since 10/18/2015"  . Drug use: No  . Sexual activity: Not Currently  Lifestyle  . Physical activity:    Days per week: Not on file    Minutes per session: Not on file  . Stress: Not on file  Relationships  . Social connections:    Talks on phone: Not on file    Gets together: Not on file    Attends religious service: Not on file    Active member of club or organization: Not on file    Attends meetings of clubs or organizations: Not on file    Relationship status: Not on file  .  Intimate partner violence:    Fear of current or ex partner: Not on file    Emotionally abused: Not on file    Physically abused: Not on file    Forced sexual activity: Not on file  Other Topics Concern  . Not on file  Social History Narrative  . Not on file   Family History  Problem Relation Age of Onset  . High blood pressure Mother   . Bipolar disorder Father   . Aneurysm Father   . Obesity Sister   . Breast cancer Sister   . Cervical cancer Sister   . Colon cancer Sister   . Bipolar disorder Sister   . Diabetes Other      OBJECTIVE:  Vitals:   01/30/18 1319  BP: 119/74  Pulse: 82  Resp: 16  Temp: 97.9 F (36.6 C)  SpO2: 100%     General appearance: Alert; appears fatigued, but nontoxic HEENT: NCAT; Ears: EACs clear, TMs pearly gray; Eyes: PERRL.  EOM grossly intact.  Sinuses nontender; Nose: no obvious rhinorrhea; tonsils nonerythematous, uvula midline  Neck: supple without LAD Lungs: diffuse wheezes and rhonchi heard throughout bilateral lung fields Heart: regular rate and rhythm.  Radial pulses 2+ symmetrical bilaterally Skin: warm and dry Psychological: alert and cooperative; normal mood and affect  ASSESSMENT & PLAN:  1. COPD exacerbation (HCC)     Meds ordered this encounter  Medications  . azithromycin (ZITHROMAX) 250 MG tablet    Sig: Take 1 tablet (250 mg total) by mouth daily. Take first 2 tablets together, then 1 every day until finished.    Dispense:  6 tablet    Refill:  0    Order Specific Question:   Supervising Provider    Answer:   Isa Rankin [phone]  . albuterol (PROVENTIL HFA;VENTOLIN HFA) 108 (90 Base) MCG/ACT inhaler 2 puff   Declines chest x-ray today Inhaler given in office use as needed for shortness of breath or wheezing Get plenty of rest and push fluids Use OTC medication as needed for symptomatic relief Take antibiotic as directed and to completion Follow up with PCP if symptoms persist Return or go to ER if  you have any new or worsening symptoms   Reviewed expectations re: course of current medical issues. Questions answered. Outlined signs and symptoms indicating need for more acute intervention. Patient verbalized understanding. After Visit Summary given.          Rennis Harding, PA-C 01/30/18 1455

## 2018-12-11 ENCOUNTER — Encounter (HOSPITAL_COMMUNITY): Payer: Self-pay

## 2018-12-11 ENCOUNTER — Other Ambulatory Visit: Payer: Self-pay

## 2018-12-11 ENCOUNTER — Telehealth (HOSPITAL_COMMUNITY): Payer: Self-pay

## 2018-12-11 ENCOUNTER — Ambulatory Visit (HOSPITAL_COMMUNITY)
Admission: EM | Admit: 2018-12-11 | Discharge: 2018-12-11 | Disposition: A | Discharge status: Home / Self Care | Payer: Medicaid Other | Attending: Family Medicine | Admitting: Family Medicine

## 2018-12-11 DIAGNOSIS — S39012A Strain of muscle, fascia and tendon of lower back, initial encounter: Secondary | ICD-10-CM | POA: Diagnosis not present

## 2018-12-11 DIAGNOSIS — T148XXA Other injury of unspecified body region, initial encounter: Secondary | ICD-10-CM

## 2018-12-11 MED ORDER — HYDROCODONE-ACETAMINOPHEN 5-325 MG PO TABS: 1.0000 | ORAL_TABLET | Freq: Four times a day (QID) | ORAL | 0 refills | Status: DC | PRN

## 2018-12-11 MED ORDER — TIZANIDINE HCL 4 MG PO TABS: 4.0000 mg | ORAL_TABLET | Freq: Four times a day (QID) | ORAL | 0 refills | Status: DC | PRN

## 2018-12-11 MED ORDER — METHYLPREDNISOLONE 4 MG PO TBPK: ORAL_TABLET | ORAL | 0 refills | Status: DC

## 2018-12-11 NOTE — Telephone Encounter (Signed)
Norco prescription cancelled at the [facility], written prescription sent with patient so she may pick it up at the pharmacy of her choice.

## 2018-12-11 NOTE — ED Triage Notes (Signed)
Pt presents with recurrent lower back pain that is unrelieved with change in position and OTC medication.

## 2018-12-11 NOTE — ED Provider Notes (Signed)
[place]    CSN: [id number] Arrival date & time: 12/11/18  1450      History   Chief Complaint Chief Complaint  Patient presents with  . Back Pain    HPI Theresa Mckay is a 38 y.o. female.   HPI Patient has had many episodes of low back pain.  They usually last for a few days.  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  She hurts in her central low back.  No radiation into hips or legs.  No numbness or weakness.  No bowel or bladder complaint.  No fever or chills.  No fall or injury. Past Medical History:  Diagnosis Date  . Anxiety   . Arthritis    "hip, left knee, hands" (08/06/2016)  . Avascular necrosis ([place])    "hips" (08/06/2016)  . Bipolar 1 disorder ([place])   . Childhood asthma   . Chronic back pain    "all over my back" (08/06/2016)  . Chronic bronchitis ([place])   . DDD (degenerative disc disease), cervical   . Depression   . Hip pain   . Pneumonia 2015  . PTSD (post-traumatic stress disorder) dx'd 2012  . Renal disorder   . Seizures ([place])    "non epilleptic; not often; usually when they're changing my seizure RX around" (08/06/2016)    Patient Active Problem List   Diagnosis Date Noted  . Arthritis of right hip 08/06/2016  . Avascular necrosis of hip, right ([place]) 08/01/2016  . Chronic posttraumatic stress disorder 06/19/2013  . MDD (major depressive disorder) 06/18/2013  . MDD (major depressive disorder), recurrent episode, severe ([place]) 06/17/2013  . Homicidal ideation 06/17/2013    Past Surgical History:  Procedure Laterality Date  . DILATION AND CURETTAGE OF UTERUS    . JOINT REPLACEMENT    . TOTAL HIP ARTHROPLASTY Right 08/06/2016  . TOTAL HIP ARTHROPLASTY Right 08/06/2016   Procedure: TOTAL HIP ARTHROPLASTY ANTERIOR APPROACH;  Surgeon: Frederik Pear, MD;  Location: [place];  Service: Orthopedics;  Laterality: Right;  . WISDOM TOOTH EXTRACTION      OB History   No obstetric history on file.      Home  Medications    Prior to Admission medications   Medication Sig Start Date End Date Taking? Authorizing Provider  ARIPiprazole (ABILIFY) 5 MG tablet Take 5 mg by mouth every evening.     [provider]  HYDROcodone-acetaminophen (NORCO/VICODIN) 5-325 MG tablet Take 1-2 tablets by mouth every 6 (six) hours as needed for severe pain. 12/11/18   Raylene Everts, MD  lamoTRIgine (LAMICTAL) 150 MG tablet Take 2 tablets (300 mg total) by mouth daily. 10/23/17   Vanessa Kick, MD  methylPREDNISolone (MEDROL DOSEPAK) 4 MG TBPK tablet tad 12/11/18   Raylene Everts, MD  tiZANidine (ZANAFLEX) 4 MG tablet Take 1-2 tablets (4-8 mg total) by mouth every 6 (six) hours as needed for muscle spasms. 12/11/18   Raylene Everts, MD  metFORMIN (GLUCOPHAGE) 500 MG tablet Take 500 mg by mouth 2 (two) times daily. 11/22/16 12/11/18  [provider]    Family History Family History  Problem Relation Age of Onset  . High blood pressure Mother   . Bipolar disorder Father   . Aneurysm Father   . Obesity Sister   . Breast cancer Sister   . Cervical cancer Sister   . Colon cancer Sister   . Bipolar disorder Sister   . Diabetes Other  Social History Social History   Tobacco Use  . Smoking status: Current Every Day Smoker    Packs/day: 0.75    Years: 18.00    Pack years: 13.50    Types: Cigarettes  . Smokeless tobacco: Never Used  Substance Use Topics  . Alcohol use: Yes    Comment: 08/06/2016 "drank some in college; nothing since 10/18/2015"  . Drug use: No     Allergies   Latex, Zyprexa [olanzapine], Gabapentin, Augmentin [amoxicillin-pot clavulanate], and Tramadol   Review of Systems Review of Systems  Constitutional: Negative for chills and fever.  HENT: Negative for ear pain and sore throat.   Eyes: Negative for pain and visual disturbance.  Respiratory: Negative for cough and shortness of breath.   Cardiovascular: Negative for chest pain and palpitations.   Gastrointestinal: Negative for abdominal pain and vomiting.  Genitourinary: Negative for dysuria and hematuria.  Musculoskeletal: Positive for arthralgias and back pain.       Chronic avascular necrosis of hips  Skin: Negative for color change and rash.  Neurological: Negative for seizures and syncope.  All other systems reviewed and are negative.    Physical Exam Triage Vital Signs ED Triage Vitals [12/11/18 1508]  Enc Vitals Group     BP (!) 127/92     Pulse Rate (!) 101     Resp 17     Temp 99.5 F (37.5 C)     Temp Source Oral     SpO2 98 %     Weight      Height      Head Circumference      Peak Flow      Pain Score 9     Pain Loc      Pain Edu?      Excl. in GC?    No data found.  Updated Vital Signs BP (!) 127/92 (BP Location: Left Arm)   Pulse (!) 101   Temp 99.5 F (37.5 C) (Oral)   Resp 17   LMP 11/28/2018   SpO2 98%    Physical Exam Constitutional:      General: She is not in acute distress.    Appearance: She is well-developed. She is obese.     Comments: No acute distress  HENT:     Head: Normocephalic and atraumatic.  Eyes:     Conjunctiva/sclera: Conjunctivae normal.     Pupils: Pupils are equal, round, and reactive to light.  Neck:     Musculoskeletal: Normal range of motion.  Cardiovascular:     Rate and Rhythm: Normal rate.  Pulmonary:     Effort: Pulmonary effort is normal. No respiratory distress.  Abdominal:     General: There is no distension.     Palpations: Abdomen is soft.  Musculoskeletal: Normal range of motion.     Comments: Mild tenderness of lumbar muscles.  Slow but full range of motion.  Strength sensation range of motion and reflexes are normal in all 4 extremities.  Straight leg raise is negative bilaterally  Skin:    General: Skin is warm and dry.  Neurological:     General: No focal deficit present.     Mental Status: She is alert.     Gait: Gait normal.     Deep Tendon Reflexes: Reflexes normal.  Psychiatric:         Mood and Affect: Mood normal.        Behavior: Behavior normal.      UC Treatments / Results  Labs (all labs ordered are listed, but only abnormal results are displayed) Labs Reviewed - No data to display  EKG   Radiology No results found.  Procedures Procedures (including critical care time)  Medications Ordered in UC Medications - No data to display  Initial Impression / Assessment and Plan / UC Course  I have reviewed the triage vital signs and the nursing notes.  Pertinent labs & imaging results that were available during my care of the patient were reviewed by me and considered in my medical decision making (see chart for details).     Discussed muscular back pain. Final Clinical Impressions(s) / UC Diagnoses   Final diagnoses:  Muscle strain     Discharge Instructions     Activity as tolerated Take the medrol pak as instructed  Take all of day one today Take the muscle relaxer as needed Take the hydrcodone if pain severe.  Do not drive on hydrocodone When finished with medrol may take ibuprofen Ice or heat to area    Controlled Substance Prescriptions [place] Controlled Substance Registry consulted? Yes, I have consulted the  Controlled Substances Registry for this patient, and feel the risk/benefit ratio today is favorable for proceeding with this prescription for a controlled substance.   Eustace MooreNelson, Roniyah Llorens Sue, MD 12/11/18 [phone]

## 2018-12-11 NOTE — Discharge Instructions (Signed)
Activity as tolerated Take the medrol pak as instructed  Take all of day one today Take the muscle relaxer as needed Take the hydrcodone if pain severe.  Do not drive on hydrocodone When finished with medrol may take ibuprofen Ice or heat to area

## 2019-02-01 IMAGING — DX DG KNEE 1-2V*L*
2 series · 2 of 2 positions shown · non-contrast
Comparison: None.

CLINICAL DATA: Left knee pain, fall

EXAM:
LEFT KNEE - 1-2 VIEW

[knee ap]
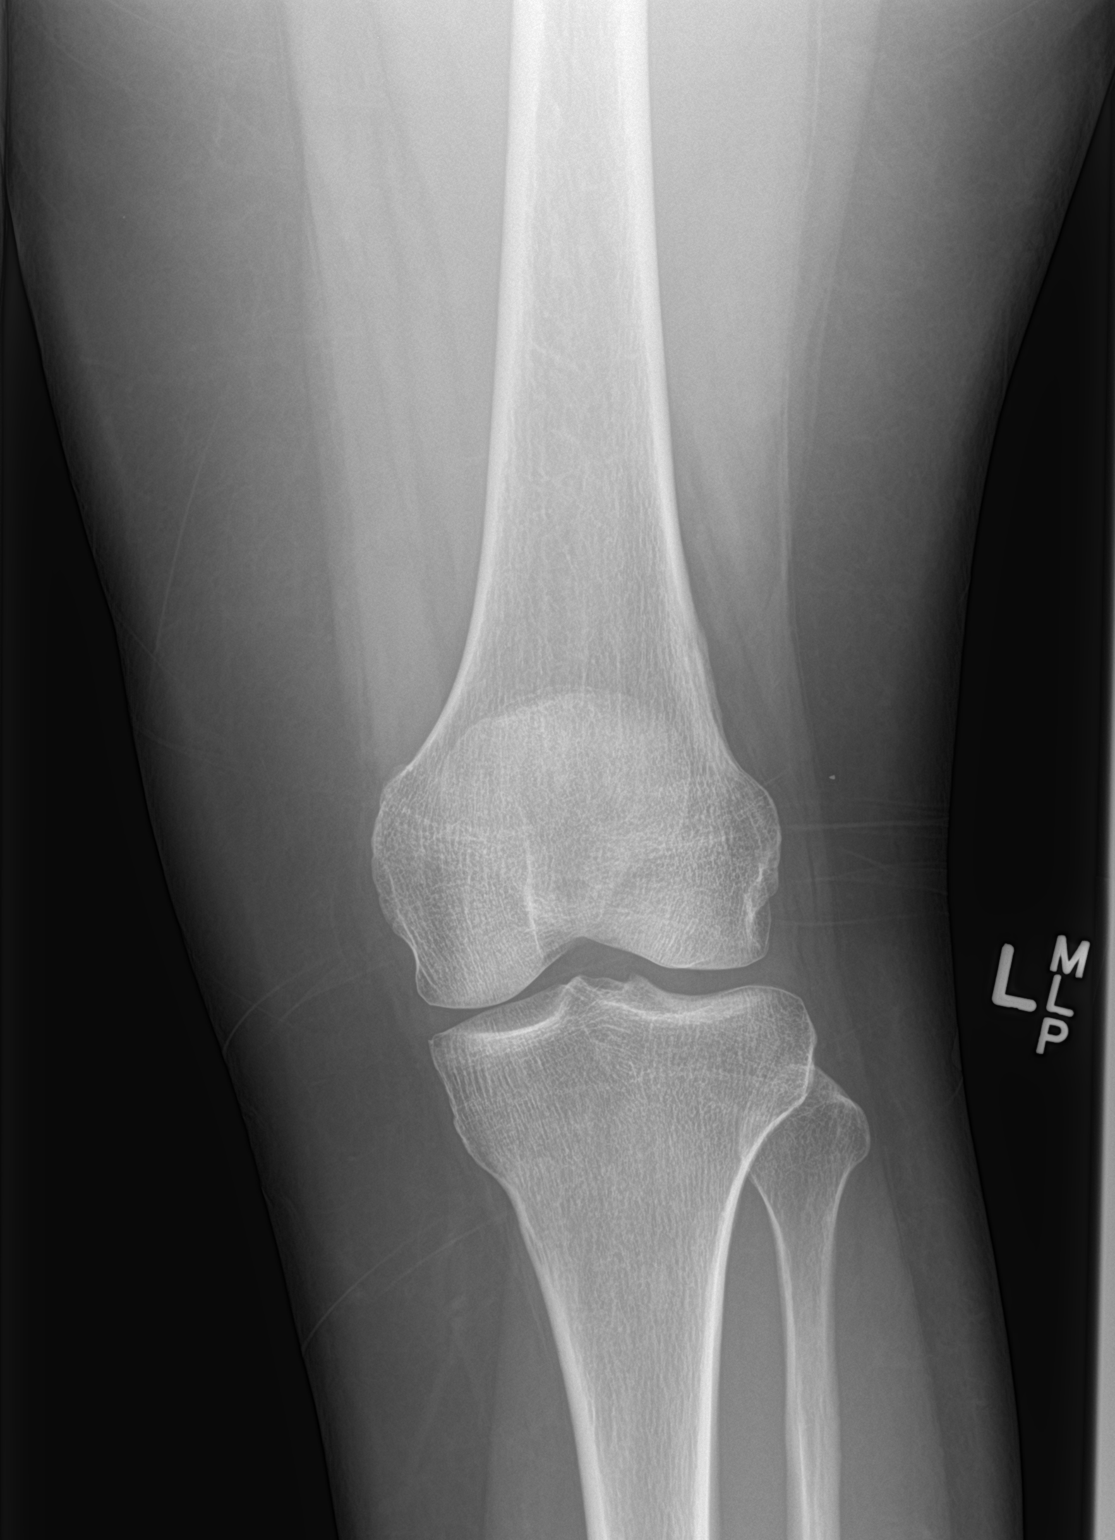

[knee lat]
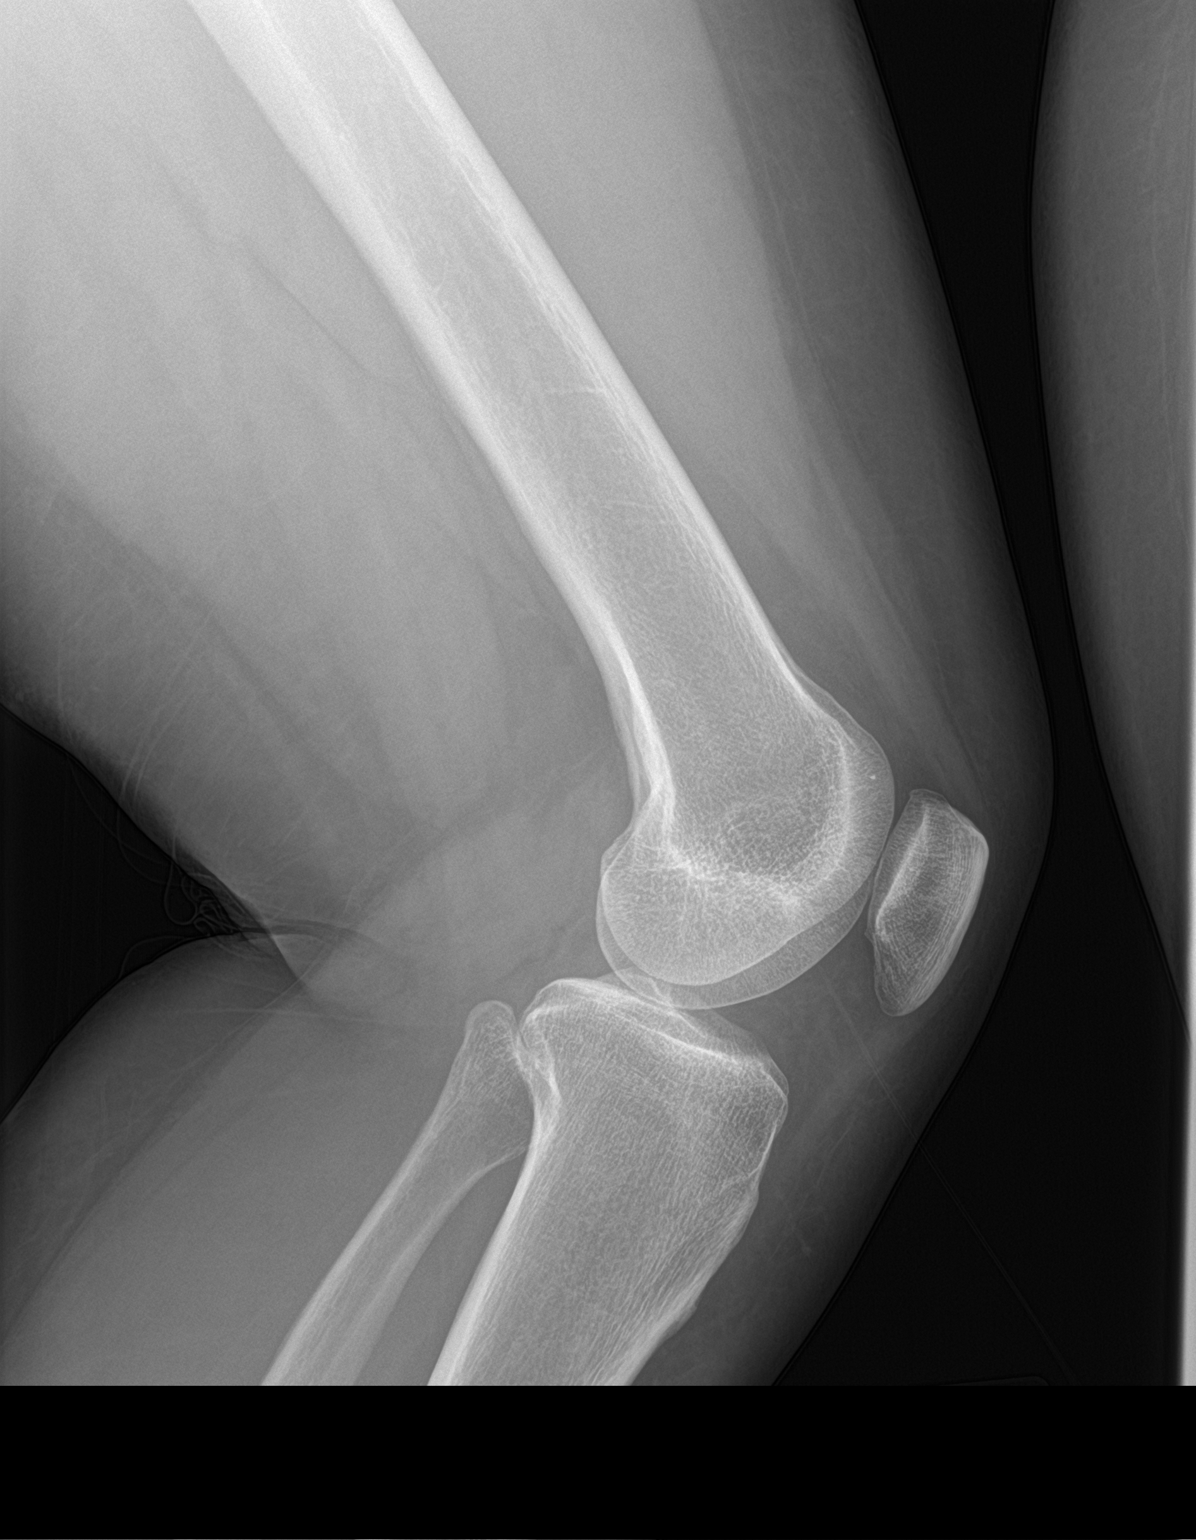

[2 of 2 positions shown; findings below may reference images not displayed]

FINDINGS: No acute bony abnormality. Specifically, no fracture, subluxation,
or dislocation. Soft tissues are intact. No joint effusion
IMPRESSION: No acute bony abnormality.

## 2019-04-29 ENCOUNTER — Other Ambulatory Visit: Payer: Self-pay

## 2019-04-29 ENCOUNTER — Ambulatory Visit (INDEPENDENT_AMBULATORY_CARE_PROVIDER_SITE_OTHER): Payer: Medicaid Other

## 2019-04-29 ENCOUNTER — Ambulatory Visit (HOSPITAL_COMMUNITY)
Admission: EM | Admit: 2019-04-29 | Discharge: 2019-04-29 | Disposition: A | Discharge status: Home / Self Care | Payer: Medicaid Other

## 2019-04-29 ENCOUNTER — Encounter (HOSPITAL_COMMUNITY): Payer: Self-pay

## 2019-04-29 DIAGNOSIS — M25559 Pain in unspecified hip: Secondary | ICD-10-CM | POA: Diagnosis not present

## 2019-04-29 DIAGNOSIS — M87051 Idiopathic aseptic necrosis of right femur: Secondary | ICD-10-CM

## 2019-04-29 MED ORDER — TIZANIDINE HCL 4 MG PO TABS: 4.0000 mg | ORAL_TABLET | Freq: Three times a day (TID) | ORAL | 0 refills | Status: DC | PRN

## 2019-04-29 MED ORDER — PREDNISONE 20 MG PO TABS: 20.0000 mg | ORAL_TABLET | Freq: Every day | ORAL | 0 refills | Status: DC

## 2019-04-29 NOTE — ED Triage Notes (Addendum)
Patient presents to Urgent Care with complaints of right hip pain since last night. Patient reports she had it replaced some years ago but did not have any recent injuries, is ambulatory to exam room.  Pt requesting paper prescription if prescriptions are given during this visit due to inability to fill anything not prescribed by her PCP because she was "locked in" while she was being prescribed narcotics for her hip replacement.

## 2019-04-29 NOTE — ED Provider Notes (Signed)
[facility]   MRN: [id number] DOB: 14-Dec-1980  Subjective:   Theresa Mckay is a 39 y.o. female presenting for 1 day history of right hip pain elicited by bearing weight on her right leg. Works as a Financial risk analyst, stands for long periods of time and is very active. Has a hx of right replacement due to avascular necrosis of the same hip. Has used ibuprofen with muscle relaxers. Has also been told that she has AVN of left hip but milder and has not needed left hip replacement.   No current facility-administered medications for this encounter.  Current Outpatient Medications:  .  ARIPiprazole (ABILIFY) 5 MG tablet, Take 5 mg by mouth every evening. , Disp: , Rfl:  .  lisdexamfetamine (VYVANSE) 60 MG capsule, Take by mouth., Disp: , Rfl:  .  HYDROcodone-acetaminophen (NORCO/VICODIN) 5-325 MG tablet, Take 1-2 tablets by mouth every 6 (six) hours as needed for severe pain., Disp: 10 tablet, Rfl: 0 .  lamoTRIgine (LAMICTAL) 150 MG tablet, Take 2 tablets (300 mg total) by mouth daily., Disp: 60 tablet, Rfl: 0 .  methylPREDNISolone (MEDROL DOSEPAK) 4 MG TBPK tablet, tad, Disp: 21 tablet, Rfl: 0 .  tiZANidine (ZANAFLEX) 4 MG tablet, Take 1-2 tablets (4-8 mg total) by mouth every 6 (six) hours as needed for muscle spasms., Disp: 21 tablet, Rfl: 0   Allergies  Allergen Reactions  . Latex     Hives, swelling  . Zyprexa [Olanzapine]     Becomes psychotic  . Gabapentin     Increased BP  . Augmentin [Amoxicillin-Pot Clavulanate] Diarrhea and Nausea And Vomiting  . Tramadol     rash    Past Medical History:  Diagnosis Date  . Anxiety   . Arthritis    "hip, left knee, hands" (08/06/2016)  . Avascular necrosis (HCC)    "hips" (08/06/2016)  . Bipolar 1 disorder (HCC)   . Childhood asthma   . Chronic back pain    "all over my back" (08/06/2016)  . Chronic bronchitis (HCC)   . DDD (degenerative disc disease), cervical   . Depression   . Hip pain   . Pneumonia 2015  . PTSD (post-traumatic  stress disorder) dx'd 2012  . Renal disorder   . Seizures (HCC)    "non epilleptic; not often; usually when they're changing my seizure RX around" (08/06/2016)     Past Surgical History:  Procedure Laterality Date  . DILATION AND CURETTAGE OF UTERUS    . JOINT REPLACEMENT    . TOTAL HIP ARTHROPLASTY Right 08/06/2016  . TOTAL HIP ARTHROPLASTY Right 08/06/2016   Procedure: TOTAL HIP ARTHROPLASTY ANTERIOR APPROACH;  Surgeon: Gean Birchwood, MD;  Location: [facility];  Service: Orthopedics;  Laterality: Right;  . WISDOM TOOTH EXTRACTION      Family History  Problem Relation Age of Onset  . Hypertension Mother   . Bipolar disorder Father   . Aneurysm Father   . Obesity Sister   . Breast cancer Sister   . Cervical cancer Sister   . Colon cancer Sister   . Bipolar disorder Sister   . Diabetes Other     Social History   Tobacco Use  . Smoking status: Current Every Day Smoker    Packs/day: 1.00    Years: 18.00    Pack years: 18.00    Types: Cigarettes  . Smokeless tobacco: Never Used  Substance Use Topics  . Alcohol use: Yes    Comment: 08/06/2016 "drank some in college; nothing since 10/18/2015"  . Drug  use: No    ROS Denies falls, trauma, weakness, swelling, warmth, erythema.  Objective:   Vitals: BP 116/83 (BP Location: Right Arm)   Pulse 80   Temp 98.2 F (36.8 C) (Oral)   Resp 16   SpO2 100%   Physical Exam Constitutional:      General: She is not in acute distress.    Appearance: Normal appearance. She is well-developed. She is obese. She is not ill-appearing, toxic-appearing or diaphoretic.  HENT:     Head: Normocephalic and atraumatic.     Nose: Nose normal.     Mouth/Throat:     Mouth: Mucous membranes are moist.     Pharynx: Oropharynx is clear.  Eyes:     General: No scleral icterus.    Extraocular Movements: Extraocular movements intact.     Pupils: Pupils are equal, round, and reactive to light.  Cardiovascular:     Rate and Rhythm: Normal rate.    Pulmonary:     Effort: Pulmonary effort is normal.  Musculoskeletal:     Right hip: No deformity, lacerations, tenderness, bony tenderness or crepitus. Normal range of motion. Normal strength.  Skin:    General: Skin is warm and dry.  Neurological:     General: No focal deficit present.     Mental Status: She is alert and oriented to person, place, and time.  Psychiatric:        Mood and Affect: Mood normal.        Behavior: Behavior normal.     DG Hip Unilat With Pelvis 2-3 Views Right  Result Date: 04/29/2019 CLINICAL DATA:  Right hip pain.  No known injury. EXAM: DG HIP (WITH OR WITHOUT PELVIS) 2-3V RIGHT COMPARISON:  05/26/2016 FINDINGS: Changes of right hip replacement. No acute bony abnormality. Specifically, no fracture, subluxation, or dislocation. No hardware complicating feature. IMPRESSION: Right hip replacement.  No acute bony abnormality. Electronically Signed   By: Rolm Baptise M.D.   On: 04/29/2019 17:49    IMPRESSION: New right hip arthroplasty without evidence of dislocation or hardware failure. No acute osseous abnormality of the pelvis and native left hip.   Electronically Signed   By: Ashley Royalty M.D.   On: 10/30/2016 20:01  Assessment and Plan :   1. Hip pain   2. Avascular necrosis of bone of hip, right (HCC)    Suspect hip pain is related to overuse and her history of her right hip replacement.  Imaging from 2018 status post right hip replacement do not demonstrate any kind of AVN for her left hip and is otherwise normal.  Counseled patient that we should use prednisone course as ibuprofen has not helped.  I declined to provide patient with opioid medication but she was eventually agreeable to using 20 mg prednisone for 7 days and take a day off from work. Counseled patient on potential for adverse effects with medications prescribed/recommended today, ER and return-to-clinic precautions discussed, patient verbalized understanding.    Jaynee Eagles,  PA-C 04/29/19 1815

## 2019-07-23 ENCOUNTER — Encounter (HOSPITAL_COMMUNITY): Payer: Self-pay

## 2019-07-23 ENCOUNTER — Other Ambulatory Visit: Payer: Self-pay

## 2019-07-23 ENCOUNTER — Ambulatory Visit (HOSPITAL_COMMUNITY)
Admission: EM | Admit: 2019-07-23 | Discharge: 2019-07-23 | Disposition: A | Discharge status: Home / Self Care | Payer: Medicaid Other

## 2019-07-23 DIAGNOSIS — G4489 Other headache syndrome: Secondary | ICD-10-CM

## 2019-07-23 DIAGNOSIS — R059 Cough, unspecified: Secondary | ICD-10-CM

## 2019-07-23 DIAGNOSIS — J3489 Other specified disorders of nose and nasal sinuses: Secondary | ICD-10-CM

## 2019-07-23 DIAGNOSIS — J0100 Acute maxillary sinusitis, unspecified: Secondary | ICD-10-CM | POA: Diagnosis not present

## 2019-07-23 DIAGNOSIS — R05 Cough: Secondary | ICD-10-CM | POA: Diagnosis not present

## 2019-07-23 DIAGNOSIS — R0981 Nasal congestion: Secondary | ICD-10-CM

## 2019-07-23 MED ORDER — CEFPODOXIME PROXETIL 200 MG PO TABS: 200.0000 mg | ORAL_TABLET | Freq: Two times a day (BID) | ORAL | 0 refills | Status: DC

## 2019-07-23 NOTE — ED Provider Notes (Signed)
[facility]    CSN: [id number] Arrival date & time: 07/23/19  1138      History   Chief Complaint Chief Complaint  Patient presents with  . Nasal Congestion    HPI Theresa Mckay is a 39 y.o. female.   Patient reports with nasal congestion, headache that has been occurring x7 days.  Patient reports that she has taken Advil, Sudafed to relieve discomfort and this has provided temporary relief.  Patient also reports nonproductive cough.  Denies shortness of breath, chest pain, nausea, vomiting, diarrhea, chills, body aches, rash, fever, other symptoms.  Upon chart review patient has significant medical history for anxiety, depression.  Patient is taking daily medications to manage this with her primary physician.  ROS per HPI  The history is provided by the patient.    Past Medical History:  Diagnosis Date  . Anxiety   . Arthritis    "hip, left knee, hands" (08/06/2016)  . Avascular necrosis (HCC)    "hips" (08/06/2016)  . Bipolar 1 disorder (HCC)   . Childhood asthma   . Chronic back pain    "all over my back" (08/06/2016)  . Chronic bronchitis (HCC)   . DDD (degenerative disc disease), cervical   . Depression   . Hip pain   . Pneumonia 2015  . PTSD (post-traumatic stress disorder) dx'd 2012  . Renal disorder   . Seizures (HCC)    "non epilleptic; not often; usually when they're changing my seizure RX around" (08/06/2016)    Patient Active Problem List   Diagnosis Date Noted  . Arthritis of right hip 08/06/2016  . Avascular necrosis of hip, right (HCC) 08/01/2016  . Chronic posttraumatic stress disorder 06/19/2013  . MDD (major depressive disorder) 06/18/2013  . MDD (major depressive disorder), recurrent episode, severe (HCC) 06/17/2013  . Homicidal ideation 06/17/2013    Past Surgical History:  Procedure Laterality Date  . DILATION AND CURETTAGE OF UTERUS    . JOINT REPLACEMENT    . TOTAL HIP ARTHROPLASTY Right 08/06/2016  . TOTAL HIP ARTHROPLASTY  Right 08/06/2016   Procedure: TOTAL HIP ARTHROPLASTY ANTERIOR APPROACH;  Surgeon: Gean Birchwood, MD;  Location: [facility];  Service: Orthopedics;  Laterality: Right;  . WISDOM TOOTH EXTRACTION      OB History   No obstetric history on file.      Home Medications    Prior to Admission medications   Medication Sig Start Date End Date Taking? Authorizing Provider  busPIRone (BUSPAR) 15 MG tablet Take by mouth. 02/01/15  Yes [provider]  clonazePAM (KLONOPIN) 2 MG tablet TK 1 T PO  TID PRN FOR ANXIETY 02/01/15  Yes [provider]  ibuprofen (ADVIL) 800 MG tablet Take by mouth. 06/20/16  Yes [provider]  lamoTRIgine (LAMICTAL) 150 MG tablet Take by mouth. 02/01/15  Yes [provider]  misoprostol (CYTOTEC) 100 MCG tablet Place vaginally. 04/21/13  Yes [provider]  ALPRAZolam Prudy Feeler) 1 MG tablet Take by mouth.    [provider]  ARIPiprazole (ABILIFY) 15 MG tablet Take by mouth.    [provider]  ARIPiprazole (ABILIFY) 15 MG tablet Take 15 mg by mouth daily. 04/12/19   [provider]  ARIPiprazole (ABILIFY) 5 MG tablet Take 5 mg by mouth every evening.     [provider]  busPIRone (BUSPAR) 15 MG tablet Take 15 mg by mouth daily. 04/23/19   [provider]  cefpodoxime (VANTIN) 200 MG tablet Take 1 tablet (200 mg total)  by mouth 2 (two) times daily. 07/23/19   Moshe Cipro, NP  diphenhydrAMINE (SOMINEX) 25 MG tablet Take by mouth.    [provider]  HYDROcodone-acetaminophen (NORCO/VICODIN) 5-325 MG tablet Take 1-2 tablets by mouth every 6 (six) hours as needed for severe pain. 12/11/18   Eustace Moore, MD  lamoTRIgine (LAMICTAL) 150 MG tablet Take 2 tablets (300 mg total) by mouth daily. 10/23/17   Mardella Layman, MD  lamoTRIgine (LAMICTAL) 200 MG tablet Take by mouth.    [provider]  lisdexamfetamine (VYVANSE) 60 MG capsule Take by mouth.    [provider]    methylPREDNISolone (MEDROL DOSEPAK) 4 MG TBPK tablet tad 12/11/18   Eustace Moore, MD  Misc Natural Products (COLON HERBAL CLEANSER) CAPS Take by mouth.    [provider]  predniSONE (DELTASONE) 20 MG tablet Take 1 tablet (20 mg total) by mouth daily with breakfast. 04/29/19   Wallis Bamberg, PA-C  pseudoephedrine (SUDAFED) 30 MG tablet Take by mouth.    [provider]  tiZANidine (ZANAFLEX) 4 MG tablet Take 1 tablet (4 mg total) by mouth every 8 (eight) hours as needed for muscle spasms. 04/29/19   Wallis Bamberg, PA-C  topiramate (TOPAMAX) 25 MG capsule Take by mouth.    [provider]  VYVANSE 30 MG capsule TAKE 1 CAPSULE BY MOUTH TWICE DAILY FILL DATE 12 23 2020 04/14/19   [provider]  metFORMIN (GLUCOPHAGE) 500 MG tablet Take 500 mg by mouth 2 (two) times daily. 11/22/16 12/11/18  [provider]    Family History Family History  Problem Relation Age of Onset  . Hypertension Mother   . Bipolar disorder Father   . Aneurysm Father   . Obesity Sister   . Breast cancer Sister   . Cervical cancer Sister   . Colon cancer Sister   . Bipolar disorder Sister   . Diabetes Other     Social History Social History   Tobacco Use  . Smoking status: Current Every Day Smoker    Packs/day: 1.00    Years: 18.00    Pack years: 18.00    Types: Cigarettes  . Smokeless tobacco: Never Used  Substance Use Topics  . Alcohol use: Yes    Comment: 08/06/2016 "drank some in college; nothing since 10/18/2015"  . Drug use: Never     Allergies   Latex, Olanzapine, Amoxicillin-pot clavulanate, Gabapentin, Tramadol, and Other   Review of Systems Review of Systems   Physical Exam Triage Vital Signs ED Triage Vitals  Enc Vitals Group     BP 07/23/19 1152 121/77     Pulse Rate 07/23/19 1152 91     Resp 07/23/19 1152 19     Temp 07/23/19 1152 98.3 F (36.8 C)     Temp Source 07/23/19 1152 Oral     SpO2 07/23/19 1152 99 %     Weight --      Height  --      Head Circumference --      Peak Flow --      Pain Score 07/23/19 1234 0     Pain Loc --      Pain Edu? --      Excl. in [place]? --    No data found.  Updated Vital Signs BP 121/77 (BP Location: Left Arm)   Pulse 91   Temp 98.3 F (36.8 C) (Oral)   Resp 19   LMP 07/18/2019   SpO2 99%   Visual Acuity  Right Eye Distance:   Left Eye Distance:   Bilateral Distance:    Right Eye Near:   Left Eye Near:    Bilateral Near:     Physical Exam Vitals and nursing note reviewed.  Constitutional:      General: She is not in acute distress.    Appearance: Normal appearance. She is well-developed and normal weight. She is ill-appearing.  HENT:     Head: Normocephalic and atraumatic.     Right Ear: Tympanic membrane normal.     Left Ear: Tympanic membrane normal.     Nose: Congestion and rhinorrhea present.     Right Sinus: Frontal sinus tenderness present. No maxillary sinus tenderness.     Left Sinus: Maxillary sinus tenderness and frontal sinus tenderness present.     Mouth/Throat:     Mouth: Mucous membranes are moist.     Pharynx: Oropharynx is clear.  Eyes:     Extraocular Movements: Extraocular movements intact.     Conjunctiva/sclera: Conjunctivae normal.     Pupils: Pupils are equal, round, and reactive to light.  Cardiovascular:     Rate and Rhythm: Normal rate and regular rhythm.     Heart sounds: Normal heart sounds. No murmur.  Pulmonary:     Effort: Pulmonary effort is normal. No respiratory distress.     Breath sounds: Normal breath sounds. No stridor. No wheezing, rhonchi or rales.  Chest:     Chest wall: No tenderness.  Abdominal:     General: Bowel sounds are normal. There is no distension.     Palpations: Abdomen is soft. There is no mass.     Tenderness: There is no abdominal tenderness. There is no right CVA tenderness, left CVA tenderness, guarding or rebound.     Hernia: No hernia is present.  Musculoskeletal:        General: Normal range of  motion.     Cervical back: Normal range of motion and neck supple.  Lymphadenopathy:     Cervical: Cervical adenopathy (Bilateral) present.  Skin:    General: Skin is warm and dry.     Capillary Refill: Capillary refill takes less than 2 seconds.  Neurological:     General: No focal deficit present.     Mental Status: She is alert and oriented to person, place, and time.  Psychiatric:        Mood and Affect: Mood normal.        Behavior: Behavior normal.        Thought Content: Thought content normal.      UC Treatments / Results  Labs (all labs ordered are listed, but only abnormal results are displayed) Labs Reviewed - No data to display  EKG   Radiology No results found.  Procedures Procedures (including critical care time)  Medications Ordered in UC Medications - No data to display  Initial Impression / Assessment and Plan / UC Course  I have reviewed the triage vital signs and the nursing notes.  Pertinent labs & imaging results that were available during my care of the patient were reviewed by me and considered in my medical decision making (see chart for details).     Acute sinusitis: Presents with nasal congestion, cough, headache for the last 7 days.  Nasal congestion, rhinorrhea, frontal and maxillary sinus tenderness noted on exam.  Cervical adenopathy also present, patient is also ill-appearing.  Prescribed Cefpodoxime 200 mg twice daily x7 days.  Patient instructed that if she is not feeling better over  the next 2 days, to follow-up with this office or with her primary care physician.  Patient instructed that if she is having trouble swallowing, trouble breathing, other concerning symptoms she is to follow-up with the ER for further evaluation and treatment.  Patient verbalizes understanding and agrees with treatment plan. Final Clinical Impressions(s) / UC Diagnoses   Final diagnoses:  Cough  Nasal congestion  Other headache syndrome  Acute maxillary  sinusitis, recurrence not specified  Sinus pain     Discharge Instructions     You have a sinus infection. I have sent in an antibiotic for you to take twice a day for 7 days.  If you are not improving in 2-3 days, follow up with this office or primary care provider.  You may also use Neill med sinus rinse or a neti pot.     ED Prescriptions    Medication Sig Dispense Auth. Provider   cefpodoxime (VANTIN) 200 MG tablet Take 1 tablet (200 mg total) by mouth 2 (two) times daily. 14 tablet Moshe Cipro, NP     PDMP not reviewed this encounter.   Moshe Cipro, NP 07/25/19 1111

## 2019-07-23 NOTE — ED Triage Notes (Signed)
Pt is here with nasal congestion and a headache that started a week ago. Pt has taken Advil, Sudafed to relieve discomfort.

## 2019-07-23 NOTE — Discharge Instructions (Signed)
You have a sinus infection. I have sent in an antibiotic for you to take twice a day for 7 days.  If you are not improving in 2-3 days, follow up with this office or primary care provider.  You may also use Neill med sinus rinse or a neti pot.

## 2019-08-02 ENCOUNTER — Ambulatory Visit (HOSPITAL_COMMUNITY)
Admission: EM | Admit: 2019-08-02 | Discharge: 2019-08-02 | Disposition: A | Discharge status: Home / Self Care | Payer: Medicaid Other | Attending: Family Medicine | Admitting: Family Medicine

## 2019-08-02 ENCOUNTER — Encounter (HOSPITAL_COMMUNITY): Payer: Self-pay

## 2019-08-02 ENCOUNTER — Other Ambulatory Visit: Payer: Self-pay

## 2019-08-02 DIAGNOSIS — M25551 Pain in right hip: Secondary | ICD-10-CM | POA: Diagnosis not present

## 2019-08-02 MED ORDER — TIZANIDINE HCL 4 MG PO TABS: 4.0000 mg | ORAL_TABLET | Freq: Three times a day (TID) | ORAL | 0 refills | Status: DC | PRN

## 2019-08-02 MED ORDER — HYDROCODONE-ACETAMINOPHEN 5-325 MG PO TABS: 1.0000 | ORAL_TABLET | Freq: Four times a day (QID) | ORAL | 0 refills | Status: DC | PRN

## 2019-08-02 NOTE — ED Provider Notes (Signed)
[facility]    CSN: [id number] Arrival date & time: 08/02/19  1114      History   Chief Complaint Chief Complaint  Patient presents with  . Hip Pain    HPI Theresa Mckay is a 39 y.o. female.   Established [facility] patient.  Pt c/o right hip and lower back pain for approx 2 days. 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 Reports right hip replacement approx 3 years ago. Takes she took a muscle relaxant this morning at approx 0830.  Requests hand-written/printed Rx if needed today.   She's been having problems getting her prescriptions filled at the usual pharmacy.  She has had quite a few narcotic Rx's over the past year or so.     Past Medical History:  Diagnosis Date  . Anxiety   . Arthritis    "hip, left knee, hands" (08/06/2016)  . Avascular necrosis (HCC)    "hips" (08/06/2016)  . Bipolar 1 disorder (HCC)   . Childhood asthma   . Chronic back pain    "all over my back" (08/06/2016)  . Chronic bronchitis (HCC)   . DDD (degenerative disc disease), cervical   . Depression   . Hip pain   . Pneumonia 2015  . PTSD (post-traumatic stress disorder) dx'd 2012  . Renal disorder   . Seizures (HCC)    "non epilleptic; not often; usually when they're changing my seizure RX around" (08/06/2016)    Patient Active Problem List   Diagnosis Date Noted  . Arthritis of right hip 08/06/2016  . Avascular necrosis of hip, right (HCC) 08/01/2016  . Chronic posttraumatic stress disorder 06/19/2013  . MDD (major depressive disorder) 06/18/2013  . MDD (major depressive disorder), recurrent episode, severe (HCC) 06/17/2013  . Homicidal ideation 06/17/2013    Past Surgical History:  Procedure Laterality Date  . DILATION AND CURETTAGE OF UTERUS    . JOINT REPLACEMENT    . TOTAL HIP ARTHROPLASTY Right 08/06/2016  . TOTAL HIP ARTHROPLASTY Right 08/06/2016   Procedure: TOTAL HIP ARTHROPLASTY ANTERIOR APPROACH;   Surgeon: Gean Birchwood, MD;  Location: [facility];  Service: Orthopedics;  Laterality: Right;  . WISDOM TOOTH EXTRACTION      OB History   No obstetric history on file.      Home Medications    Prior to Admission medications   Medication Sig Start Date End Date Taking? Authorizing Provider  ARIPiprazole (ABILIFY) 15 MG tablet Take by mouth.   Yes [provider]  busPIRone (BUSPAR) 15 MG tablet Take by mouth. 02/01/15  Yes [provider]  VYVANSE 30 MG capsule TAKE 1 CAPSULE BY MOUTH TWICE DAILY FILL DATE 12 23 2020 04/14/19  Yes [provider]  ARIPiprazole (ABILIFY) 15 MG tablet Take 15 mg by mouth daily. 04/12/19   [provider]  ARIPiprazole (ABILIFY) 5 MG tablet Take 5 mg by mouth every evening.     [provider]  busPIRone (BUSPAR) 15 MG tablet Take 15 mg by mouth daily. 04/23/19   [provider]  clonazePAM (KLONOPIN) 2 MG tablet TK 1 T PO  TID PRN FOR ANXIETY 02/01/15   [provider]  diphenhydrAMINE (SOMINEX) 25 MG tablet Take by mouth.    [provider]  HYDROcodone-acetaminophen (NORCO/VICODIN) 5-325 MG tablet Take 1-2 tablets by mouth every 6 (six) hours as needed for severe pain. 08/02/19   Elvina Sidle, MD  ibuprofen (ADVIL) 800 MG tablet  Take by mouth. 06/20/16   [provider]  lamoTRIgine (LAMICTAL) 150 MG tablet Take 2 tablets (300 mg total) by mouth daily. 10/23/17   Vanessa Kick, MD  lamoTRIgine (LAMICTAL) 150 MG tablet Take by mouth. 02/01/15   [provider]  lamoTRIgine (LAMICTAL) 200 MG tablet Take by mouth.    [provider]  lisdexamfetamine (VYVANSE) 60 MG capsule Take by mouth.    [provider]  Misc Natural Products (COLON HERBAL CLEANSER) CAPS Take by mouth.    [provider]  misoprostol (CYTOTEC) 100 MCG tablet Place vaginally. 04/21/13   [provider]  pseudoephedrine (SUDAFED) 30 MG tablet Take by mouth.    [provider]  tiZANidine (ZANAFLEX) 4 MG tablet Take 1 tablet (4 mg total) by mouth every 8 (eight) hours as needed for muscle spasms. 08/02/19   Robyn Haber, MD  topiramate (TOPAMAX) 25 MG capsule Take by mouth.    [provider]  metFORMIN (GLUCOPHAGE) 500 MG tablet Take 500 mg by mouth 2 (two) times daily. 11/22/16 12/11/18  [provider]    Family History Family History  Problem Relation Age of Onset  . Hypertension Mother   . Bipolar disorder Father   . Aneurysm Father   . Obesity Sister   . Breast cancer Sister   . Cervical cancer Sister   . Colon cancer Sister   . Bipolar disorder Sister   . Diabetes Other     Social History Social History   Tobacco Use  . Smoking status: Current Every Day Smoker    Packs/day: 1.00    Years: 18.00    Pack years: 18.00    Types: Cigarettes  . Smokeless tobacco: Never Used  Substance Use Topics  . Alcohol use: Yes    Comment: 08/06/2016 "drank some in college; nothing since 10/18/2015"  . Drug use: Never     Allergies   Latex, Olanzapine, Amoxicillin-pot clavulanate, Gabapentin, Tramadol, and Other   Review of Systems Review of Systems  Constitutional: Negative.   Musculoskeletal: Positive for back pain.  All other systems reviewed and are negative.    Physical Exam Triage Vital Signs ED Triage Vitals  Enc Vitals Group     BP 08/02/19 1225 114/81     Pulse Rate 08/02/19 1225 81     Resp 08/02/19 1225 16     Temp 08/02/19 1225 98 F (36.7 C)     Temp Source 08/02/19 1225 Oral     SpO2 08/02/19 1225 100 %     Weight --      Height --      Head Circumference --      Peak Flow --      Pain Score 08/02/19 1220 8     Pain Loc --      Pain Edu? --      Excl. in [place]? --    No data found.  Updated Vital Signs BP 114/81 (BP Location: Left Arm)   Pulse 81   Temp 98 F (36.7 C) (Oral)   Resp 16   LMP 07/18/2019   SpO2 100%    Physical Exam Vitals and nursing note reviewed.    Constitutional:      Appearance: Normal appearance. She is obese.  HENT:     Head: Normocephalic.  Pulmonary:     Effort: Pulmonary effort is normal.  Musculoskeletal:        General: No deformity or signs of injury.     Cervical  back: Normal range of motion and neck supple.     Comments: Pain with right SLR at 20 degrees.  Good rotation of hip.  No tenderness of thigh or trochanter  Skin:    General: Skin is warm and dry.  Neurological:     General: No focal deficit present.     Mental Status: She is alert.  Psychiatric:        Mood and Affect: Mood normal.      UC Treatments / Results  Labs (all labs ordered are listed, but only abnormal results are displayed) Labs Reviewed - No data to display  EKG   Radiology No results found.  Procedures Procedures (including critical care time)  Medications Ordered in UC Medications - No data to display  Initial Impression / Assessment and Plan / UC Course  I have reviewed the triage vital signs and the nursing notes.  Pertinent labs & imaging results that were available during my care of the patient were reviewed by me and considered in my medical decision making (see chart for details).    Final Clinical Impressions(s) / UC Diagnoses   Final diagnoses:  Acute pain of right hip     Discharge Instructions     Contact your personal physician if symptoms persist.    ED Prescriptions    Medication Sig Dispense Auth. Provider   HYDROcodone-acetaminophen (NORCO/VICODIN) 5-325 MG tablet Take 1-2 tablets by mouth every 6 (six) hours as needed for severe pain. 15 tablet Elvina Sidle, MD   tiZANidine (ZANAFLEX) 4 MG tablet Take 1 tablet (4 mg total) by mouth every 8 (eight) hours as needed for muscle spasms. 20 tablet Elvina Sidle, MD     I have reviewed the PDMP during this encounter.   Elvina Sidle, MD 08/02/19 1245

## 2019-08-02 NOTE — ED Triage Notes (Signed)
Pt c/o right hip and lower back pain for approx 2 days. 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 Reports right hip replacement approx 3 years ago. Takes she took a muscle relaxant this morning at approx 0830.  Requests hand-written/printed Rx if needed today.

## 2019-08-02 NOTE — Discharge Instructions (Signed)
Contact your personal physician if symptoms persist.

## 2019-10-23 ENCOUNTER — Encounter: Payer: Self-pay | Admitting: Gastroenterology

## 2019-11-25 ENCOUNTER — Encounter (HOSPITAL_COMMUNITY): Payer: Self-pay

## 2019-11-25 ENCOUNTER — Ambulatory Visit (HOSPITAL_COMMUNITY)
Admission: EM | Admit: 2019-11-25 | Discharge: 2019-11-25 | Disposition: A | Discharge status: Home / Self Care | Payer: Medicaid Other | Attending: Family Medicine | Admitting: Family Medicine

## 2019-11-25 ENCOUNTER — Other Ambulatory Visit: Payer: Self-pay

## 2019-11-25 DIAGNOSIS — M159 Polyosteoarthritis, unspecified: Secondary | ICD-10-CM | POA: Diagnosis not present

## 2019-11-25 DIAGNOSIS — Z7984 Long term (current) use of oral hypoglycemic drugs: Secondary | ICD-10-CM | POA: Diagnosis not present

## 2019-11-25 DIAGNOSIS — J449 Chronic obstructive pulmonary disease, unspecified: Secondary | ICD-10-CM | POA: Diagnosis not present

## 2019-11-25 DIAGNOSIS — R0981 Nasal congestion: Secondary | ICD-10-CM | POA: Diagnosis not present

## 2019-11-25 DIAGNOSIS — Z79899 Other long term (current) drug therapy: Secondary | ICD-10-CM | POA: Insufficient documentation

## 2019-11-25 DIAGNOSIS — Z791 Long term (current) use of non-steroidal anti-inflammatories (NSAID): Secondary | ICD-10-CM | POA: Insufficient documentation

## 2019-11-25 DIAGNOSIS — Z885 Allergy status to narcotic agent status: Secondary | ICD-10-CM | POA: Diagnosis not present

## 2019-11-25 DIAGNOSIS — F319 Bipolar disorder, unspecified: Secondary | ICD-10-CM | POA: Insufficient documentation

## 2019-11-25 DIAGNOSIS — J069 Acute upper respiratory infection, unspecified: Secondary | ICD-10-CM | POA: Diagnosis not present

## 2019-11-25 DIAGNOSIS — J029 Acute pharyngitis, unspecified: Secondary | ICD-10-CM | POA: Diagnosis not present

## 2019-11-25 DIAGNOSIS — Z888 Allergy status to other drugs, medicaments and biological substances status: Secondary | ICD-10-CM | POA: Diagnosis not present

## 2019-11-25 DIAGNOSIS — F17210 Nicotine dependence, cigarettes, uncomplicated: Secondary | ICD-10-CM | POA: Insufficient documentation

## 2019-11-25 DIAGNOSIS — F419 Anxiety disorder, unspecified: Secondary | ICD-10-CM | POA: Insufficient documentation

## 2019-11-25 DIAGNOSIS — Z880 Allergy status to penicillin: Secondary | ICD-10-CM | POA: Insufficient documentation

## 2019-11-25 DIAGNOSIS — Z20822 Contact with and (suspected) exposure to covid-19: Secondary | ICD-10-CM | POA: Diagnosis not present

## 2019-11-25 LAB — POCT RAPID STREP A, ED / UC: Streptococcus, Group A Screen (Direct): NEGATIVE

## 2019-11-25 MED ORDER — FLUTICASONE PROPIONATE 50 MCG/ACT NA SUSP: 1.0000 | Freq: Every day | NASAL | 0 refills | Status: DC

## 2019-11-25 MED ORDER — DM-GUAIFENESIN ER 30-600 MG PO TB12: 1.0000 | ORAL_TABLET | Freq: Two times a day (BID) | ORAL | 0 refills | Status: DC

## 2019-11-25 NOTE — ED Provider Notes (Signed)
[facility]    CSN: [id number] Arrival date & time: 11/25/19  1039      History   Chief Complaint Chief Complaint  Patient presents with   Nasal Congestion   Sore Throat    HPI Theresa Mckay is a 39 y.o. female presenting today for evaluation of URI symptoms.  Patient reports that she has had nasal congestion for 4 days, sore throat began last night.  She has been using amoxicillin from leftover prescription without relief of symptoms.  Denies any cough, chest pain or shortness of breath.  HPI  Past Medical History:  Diagnosis Date   Anxiety    Arthritis    "hip, left knee, hands" (08/06/2016)   Avascular necrosis (HCC)    "hips" (08/06/2016)   Bipolar 1 disorder (HCC)    Childhood asthma    Chronic back pain    "all over my back" (08/06/2016)   Chronic bronchitis (HCC)    DDD (degenerative disc disease), cervical    Depression    Hip pain    Pneumonia 2015   PTSD (post-traumatic stress disorder) dx'd 2012   Renal disorder    Seizures (HCC)    "non epilleptic; not often; usually when they're changing my seizure RX around" (08/06/2016)    Patient Active Problem List   Diagnosis Date Noted   Arthritis of right hip 08/06/2016   Avascular necrosis of hip, right (HCC) 08/01/2016   Chronic posttraumatic stress disorder 06/19/2013   MDD (major depressive disorder) 06/18/2013   MDD (major depressive disorder), recurrent episode, severe (HCC) 06/17/2013   Homicidal ideation 06/17/2013    Past Surgical History:  Procedure Laterality Date   DILATION AND CURETTAGE OF UTERUS     JOINT REPLACEMENT     TOTAL HIP ARTHROPLASTY Right 08/06/2016   TOTAL HIP ARTHROPLASTY Right 08/06/2016   Procedure: TOTAL HIP ARTHROPLASTY ANTERIOR APPROACH;  Surgeon: Gean Birchwood, MD;  Location: [facility];  Service: Orthopedics;  Laterality: Right;   WISDOM TOOTH EXTRACTION      OB History   No obstetric history on file.      Home Medications    Prior  to Admission medications   Medication Sig Start Date End Date Taking? Authorizing Provider  ARIPiprazole (ABILIFY) 15 MG tablet Take by mouth.    [provider]  ARIPiprazole (ABILIFY) 15 MG tablet Take 15 mg by mouth daily. 04/12/19   [provider]  ARIPiprazole (ABILIFY) 5 MG tablet Take 5 mg by mouth every evening.     [provider]  busPIRone (BUSPAR) 15 MG tablet Take by mouth. 02/01/15   [provider]  busPIRone (BUSPAR) 15 MG tablet Take 15 mg by mouth daily. 04/23/19   [provider]  clonazePAM (KLONOPIN) 2 MG tablet TK 1 T PO  TID PRN FOR ANXIETY 02/01/15   [provider]  dextromethorphan-guaiFENesin (MUCINEX DM) 30-600 MG 12hr tablet Take 1 tablet by mouth 2 (two) times daily. 11/25/19   Turon Kilmer C, PA-C  diphenhydrAMINE (SOMINEX) 25 MG tablet Take by mouth.    [provider]  fluticasone (FLONASE) 50 MCG/ACT nasal spray Place 1-2 sprays into both nostrils daily for 7 days. 11/25/19 12/02/19  Llewelyn Sheaffer C, PA-C  HYDROcodone-acetaminophen (NORCO/VICODIN) 5-325 MG tablet Take 1-2 tablets by mouth every 6 (six) hours as needed for severe pain. 08/02/19   Elvina Sidle, MD  ibuprofen (ADVIL) 800 MG tablet Take by mouth. 06/20/16   [provider]  lamoTRIgine (LAMICTAL) 150 MG tablet Take 2  tablets (300 mg total) by mouth daily. 10/23/17   Mardella Layman, MD  lamoTRIgine (LAMICTAL) 150 MG tablet Take by mouth. 02/01/15   [provider]  lamoTRIgine (LAMICTAL) 200 MG tablet Take by mouth.    [provider]  lisdexamfetamine (VYVANSE) 60 MG capsule Take by mouth.    [provider]  Misc Natural Products (COLON HERBAL CLEANSER) CAPS Take by mouth.    [provider]  misoprostol (CYTOTEC) 100 MCG tablet Place vaginally. 04/21/13   [provider]  pseudoephedrine (SUDAFED) 30 MG tablet Take by mouth.    [provider]  tiZANidine (ZANAFLEX) 4 MG  tablet Take 1 tablet (4 mg total) by mouth every 8 (eight) hours as needed for muscle spasms. 08/02/19   Elvina Sidle, MD  topiramate (TOPAMAX) 25 MG capsule Take by mouth.    [provider]  VYVANSE 30 MG capsule TAKE 1 CAPSULE BY MOUTH TWICE DAILY FILL DATE 12 23 2020 04/14/19   [provider]  metFORMIN (GLUCOPHAGE) 500 MG tablet Take 500 mg by mouth 2 (two) times daily. 11/22/16 12/11/18  [provider]    Family History Family History  Problem Relation Age of Onset   Hypertension Mother    Bipolar disorder Father    Aneurysm Father    Obesity Sister    Breast cancer Sister    Cervical cancer Sister    Colon cancer Sister    Bipolar disorder Sister    Diabetes Other     Social History Social History   Tobacco Use   Smoking status: Current Every Day Smoker    Packs/day: 1.00    Years: 18.00    Pack years: 18.00    Types: Cigarettes   Smokeless tobacco: Never Used  Vaping Use   Vaping Use: Never used  Substance Use Topics   Alcohol use: Yes    Comment: 08/06/2016 "drank some in college; nothing since 10/18/2015"   Drug use: Never     Allergies   Latex, Olanzapine, Amoxicillin-pot clavulanate, Gabapentin, Tramadol, and Other   Review of Systems Review of Systems  Constitutional: Negative for activity change, appetite change, chills, fatigue and fever.  HENT: Positive for congestion, rhinorrhea and sore throat. Negative for ear pain, sinus pressure and trouble swallowing.   Eyes: Negative for discharge and redness.  Respiratory: Negative for cough, chest tightness and shortness of breath.   Cardiovascular: Negative for chest pain.  Gastrointestinal: Negative for abdominal pain, diarrhea, nausea and vomiting.  Musculoskeletal: Negative for myalgias.  Skin: Negative for rash.  Neurological: Negative for dizziness, light-headedness and headaches.     Physical Exam Triage Vital Signs ED Triage Vitals  Enc Vitals Group      BP 11/25/19 1158 116/76     Pulse Rate 11/25/19 1158 91     Resp 11/25/19 1158 18     Temp 11/25/19 1158 97.9 F (36.6 C)     Temp Source 11/25/19 1158 Oral     SpO2 11/25/19 1158 98 %     Weight --      Height --      Head Circumference --      Peak Flow --      Pain Score 11/25/19 1154 5     Pain Loc --      Pain Edu? --      Excl. in GC? --    No data found.  Updated Vital Signs BP 116/76 (BP Location: Right Arm)    Pulse 91  Temp 97.9 F (36.6 C) (Oral)    Resp 18    LMP  (Within Weeks) Comment: 2 weeks   SpO2 98%   Visual Acuity Right Eye Distance:   Left Eye Distance:   Bilateral Distance:    Right Eye Near:   Left Eye Near:    Bilateral Near:     Physical Exam Vitals and nursing note reviewed.  Constitutional:      Appearance: She is well-developed.     Comments: No acute distress  HENT:     Head: Normocephalic and atraumatic.     Ears:     Comments: Bilateral ears without tenderness to palpation of external auricle, tragus and mastoid, EAC's without erythema or swelling, TM's with good bony landmarks and cone of light. Non erythematous.     Nose: Nose normal.     Mouth/Throat:     Comments: Oral mucosa pink and moist, no tonsillar enlargement or exudate. Posterior pharynx patent and nonerythematous, no uvula deviation or swelling. Normal phonation. Eyes:     Conjunctiva/sclera: Conjunctivae normal.  Cardiovascular:     Rate and Rhythm: Normal rate.  Pulmonary:     Effort: Pulmonary effort is normal. No respiratory distress.     Comments: Breathing comfortably at rest, CTABL, no wheezing, rales or other adventitious sounds auscultated Abdominal:     General: There is no distension.  Musculoskeletal:        General: Normal range of motion.     Cervical back: Neck supple.  Skin:    General: Skin is warm and dry.  Neurological:     Mental Status: She is alert and oriented to person, place, and time.      UC Treatments / Results  Labs (all labs  ordered are listed, but only abnormal results are displayed) Labs Reviewed  SARS CORONAVIRUS 2 (TAT 6-24 HRS)  CULTURE, GROUP A STREP [facility])  POCT RAPID STREP A, ED / UC    EKG   Radiology No results found.  Procedures Procedures (including critical care time)  Medications Ordered in UC Medications - No data to display  Initial Impression / Assessment and Plan / UC Course  I have reviewed the triage vital signs and the nursing notes.  Pertinent labs & imaging results that were available during my care of the patient were reviewed by me and considered in my medical decision making (see chart for details).     URI symptoms x4 days, new onset sore throat in the past 24 hours.  Strep test negative.  Covid pending.  Suspect likely viral etiology and recommending continued symptomatic and supportive care.  Rest and fluids.  Discussed strict return precautions. Patient verbalized understanding and is agreeable with plan.  Final Clinical Impressions(s) / UC Diagnoses   Final diagnoses:  Viral URI     Discharge Instructions     Strep test negative, Covid test pending Rest and fluids Tylenol and ibuprofen for sore throat, headache Continue Zyrtec, add in Flonase, Mucinex Please follow-up if any symptoms not improving or worsening    ED Prescriptions    Medication Sig Dispense Auth. Provider   fluticasone (FLONASE) 50 MCG/ACT nasal spray Place 1-2 sprays into both nostrils daily for 7 days. 1 g Zakar Brosch C, PA-C   dextromethorphan-guaiFENesin (MUCINEX DM) 30-600 MG 12hr tablet Take 1 tablet by mouth 2 (two) times daily. 20 tablet Mahki Spikes, [place] C, PA-C     PDMP not reviewed this encounter.   Sharyon Cable Tull C, PA-C 11/25/19 1322

## 2019-11-25 NOTE — Discharge Instructions (Addendum)
Strep test negative, Covid test pending Rest and fluids Tylenol and ibuprofen for sore throat, headache Continue Zyrtec, add in Flonase, Mucinex Please follow-up if any symptoms not improving or worsening

## 2019-11-25 NOTE — ED Triage Notes (Signed)
Pt presents with sore throat x 1 day and nasal congestion x 4 days. Pt states she was taking left over amoxicillin 3 days last night was the last dose.

## 2019-11-26 LAB — SARS CORONAVIRUS 2 (TAT 6-24 HRS): SARS Coronavirus 2: NEGATIVE

## 2019-11-28 LAB — CULTURE, GROUP A STREP (THRC)

## 2019-12-09 ENCOUNTER — Telehealth: Payer: Self-pay | Admitting: *Deleted

## 2019-12-09 NOTE — Telephone Encounter (Signed)
If seizures is an old issue and controlled, then I think okay to proceed. Can you clarify hold old her sister was at age of diagnosis of colon cancer, to determine if she is due for this? Pending that I think okay to proceed if seizures well controlled but I don't see any documentation in her chart about it. Cathlyn Parsons may want to weigh in on this. Thanks

## 2019-12-09 NOTE — Telephone Encounter (Signed)
Dr.Armbruster,  Patient is 39 years old, hx seizures.  No GI hx. She was referred to you for direct colon due to family hx colon cancer-sister. Please review. Ok to proceed with the direct colonoscopy or OV ? Thanks, Jarriel Papillion pv

## 2019-12-09 NOTE — Telephone Encounter (Signed)
Called patient, no answer, left a message for the patient to call me back.

## 2019-12-11 NOTE — Telephone Encounter (Signed)
Spoke with the patient. She states she had seizures due to a medication she was taking and that when she stopped the medication the seizures stopped, her last seizure  was about 5 years ago. Patient also states she believes her sister was dx w/colon ca at age 38 and dx breast cancer at age 31. Ok to proceed w/colon at [facility] ?

## 2019-12-11 NOTE — Telephone Encounter (Signed)
Thank you. Okay to proceed with colonoscopy given that history if you can book it. thanks

## 2019-12-12 NOTE — Telephone Encounter (Signed)
Dr. Armbruster,  This pt is cleared for anesthetic care at [facility].  Thanks,  Lateesha Bezold 

## 2019-12-14 NOTE — Telephone Encounter (Signed)
Noted thanks °

## 2019-12-22 ENCOUNTER — Telehealth: Payer: Self-pay

## 2019-12-22 NOTE — Telephone Encounter (Signed)
Pt was NS for PV-message left requesting call back by 5 pm today 12/22/19 to keep procedure scheduled.    No return call by 5:00 pm.  Procedure cancelled with NS letter sent.

## 2020-01-04 ENCOUNTER — Encounter: Payer: Medicaid Other | Admitting: Gastroenterology

## 2020-02-01 ENCOUNTER — Encounter (HOSPITAL_COMMUNITY): Payer: Self-pay

## 2020-02-01 ENCOUNTER — Ambulatory Visit (HOSPITAL_COMMUNITY)
Admission: RE | Admit: 2020-02-01 | Discharge: 2020-02-01 | Disposition: A | Discharge status: Home / Self Care | Payer: Medicaid Other | Source: Ambulatory Visit | Attending: Family Medicine | Admitting: Family Medicine

## 2020-02-01 ENCOUNTER — Other Ambulatory Visit: Payer: Self-pay

## 2020-02-01 VITALS — BP 118/84 | HR 87 | Temp 98.7°F | Resp 20

## 2020-02-01 DIAGNOSIS — Z96641 Presence of right artificial hip joint: Secondary | ICD-10-CM

## 2020-02-01 DIAGNOSIS — G894 Chronic pain syndrome: Secondary | ICD-10-CM

## 2020-02-01 DIAGNOSIS — M25551 Pain in right hip: Secondary | ICD-10-CM

## 2020-02-01 MED ORDER — TIZANIDINE HCL 4 MG PO TABS: 4.0000 mg | ORAL_TABLET | Freq: Three times a day (TID) | ORAL | 0 refills | Status: DC | PRN

## 2020-02-01 MED ORDER — HYDROCODONE-ACETAMINOPHEN 5-325 MG PO TABS: 1.0000 | ORAL_TABLET | Freq: Three times a day (TID) | ORAL | 0 refills | Status: DC | PRN

## 2020-02-01 MED ORDER — IBUPROFEN 800 MG PO TABS: 800.0000 mg | ORAL_TABLET | Freq: Three times a day (TID) | ORAL | 5 refills | Status: DC | PRN

## 2020-02-01 NOTE — ED Triage Notes (Signed)
Pt presents with chronic recurrent lower back and right side hip pain: current episode being since Saturday.

## 2020-02-01 NOTE — Discharge Instructions (Addendum)
Please schedule ibuprofen 3 times daily with food as needed for your severe pain.  If you still have pain despite taking ibuprofen, this is breakthrough pain.  You can use hydrocodone, a narcotic pain medicine, once every 8 hours for this.  Once your pain is better controlled, switch back to just ibuprofen. If your symptoms persist or worsen, you may return to our clinic for a recheck.

## 2020-02-01 NOTE — ED Provider Notes (Signed)
[facility]   MRN: [id number] DOB: 05/07/1980  Subjective:   Theresa Mckay is a 39 y.o. female presenting for 2 to 3-day history of acute on chronic right hip pain and low back pain.  Patient has a history of right hip replacement, had avascular necrosis.  Denies any new fall or trauma.  Admits that she has been working very long hours this past week and this usually aggravates her pain.  She would like to have a refill of ibuprofen and does well with muscle relaxant, small supply of a painkiller.  No current facility-administered medications for this encounter.  Current Outpatient Medications:    ARIPiprazole (ABILIFY) 15 MG tablet, Take by mouth., Disp: , Rfl:    ARIPiprazole (ABILIFY) 15 MG tablet, Take 15 mg by mouth daily., Disp: , Rfl:    ARIPiprazole (ABILIFY) 5 MG tablet, Take 5 mg by mouth every evening. , Disp: , Rfl:    busPIRone (BUSPAR) 15 MG tablet, Take by mouth., Disp: , Rfl:    busPIRone (BUSPAR) 15 MG tablet, Take 15 mg by mouth daily., Disp: , Rfl:    clonazePAM (KLONOPIN) 2 MG tablet, TK 1 T PO  TID PRN FOR ANXIETY, Disp: , Rfl:    dextromethorphan-guaiFENesin (MUCINEX DM) 30-600 MG 12hr tablet, Take 1 tablet by mouth 2 (two) times daily., Disp: 20 tablet, Rfl: 0   diphenhydrAMINE (SOMINEX) 25 MG tablet, Take by mouth., Disp: , Rfl:    fluticasone (FLONASE) 50 MCG/ACT nasal spray, Place 1-2 sprays into both nostrils daily for 7 days., Disp: 1 g, Rfl: 0   HYDROcodone-acetaminophen (NORCO/VICODIN) 5-325 MG tablet, Take 1-2 tablets by mouth every 6 (six) hours as needed for severe pain., Disp: 15 tablet, Rfl: 0   ibuprofen (ADVIL) 800 MG tablet, Take by mouth., Disp: , Rfl:    lamoTRIgine (LAMICTAL) 150 MG tablet, Take 2 tablets (300 mg total) by mouth daily., Disp: 60 tablet, Rfl: 0   lamoTRIgine (LAMICTAL) 150 MG tablet, Take by mouth., Disp: , Rfl:    lamoTRIgine (LAMICTAL) 200 MG tablet, Take by mouth., Disp: , Rfl:    lisdexamfetamine  (VYVANSE) 60 MG capsule, Take by mouth., Disp: , Rfl:    Misc Natural Products (COLON HERBAL CLEANSER) CAPS, Take by mouth., Disp: , Rfl:    misoprostol (CYTOTEC) 100 MCG tablet, Place vaginally., Disp: , Rfl:    pseudoephedrine (SUDAFED) 30 MG tablet, Take by mouth., Disp: , Rfl:    tiZANidine (ZANAFLEX) 4 MG tablet, Take 1 tablet (4 mg total) by mouth every 8 (eight) hours as needed for muscle spasms., Disp: 20 tablet, Rfl: 0   topiramate (TOPAMAX) 25 MG capsule, Take by mouth., Disp: , Rfl:    VYVANSE 30 MG capsule, TAKE 1 CAPSULE BY MOUTH TWICE DAILY FILL DATE 12 23 2020, Disp: , Rfl:    Allergies  Allergen Reactions   Latex Hives    Hives, swelling   Olanzapine Other (See Comments)    Becomes psychotic pyschotic breakdown Psychosis    Amoxicillin-Pot Clavulanate Diarrhea, Nausea And Vomiting and Other (See Comments)    "Made me feel like I have the flu"  "Made me feel like I have the flu"   Gabapentin Other (See Comments)    Increased BP Elevates blood pressure   Tramadol Hives, Itching and Rash    rash   Other Rash    Past Medical History:  Diagnosis Date   Anxiety    Arthritis    "hip, left knee, hands" (08/06/2016)  Avascular necrosis (HCC)    "hips" (08/06/2016)   Bipolar 1 disorder (HCC)    Childhood asthma    Chronic back pain    "all over my back" (08/06/2016)   Chronic bronchitis (HCC)    DDD (degenerative disc disease), cervical    Depression    Hip pain    Pneumonia 2015   PTSD (post-traumatic stress disorder) dx'd 2012   Renal disorder    Seizures (HCC)    "non epilleptic; not often; usually when they're changing my seizure RX around" (08/06/2016)     Past Surgical History:  Procedure Laterality Date   DILATION AND CURETTAGE OF UTERUS     JOINT REPLACEMENT     TOTAL HIP ARTHROPLASTY Right 08/06/2016   TOTAL HIP ARTHROPLASTY Right 08/06/2016   Procedure: TOTAL HIP ARTHROPLASTY ANTERIOR APPROACH;  Surgeon: Gean Birchwood,  MD;  Location: [facility];  Service: Orthopedics;  Laterality: Right;   WISDOM TOOTH EXTRACTION      Family History  Problem Relation Age of Onset   Hypertension Mother    Bipolar disorder Father    Aneurysm Father    Obesity Sister    Breast cancer Sister    Cervical cancer Sister    Colon cancer Sister    Bipolar disorder Sister    Diabetes Other     Social History   Tobacco Use   Smoking status: Current Every Day Smoker    Packs/day: 1.00    Years: 18.00    Pack years: 18.00    Types: Cigarettes   Smokeless tobacco: Never Used  Vaping Use   Vaping Use: Never used  Substance Use Topics   Alcohol use: Yes    Comment: 08/06/2016 "drank some in college; nothing since 10/18/2015"   Drug use: Never    ROS   Objective:   Vitals: BP 118/84 (BP Location: Right Arm)    Pulse 87    Temp 98.7 F (37.1 C) (Oral)    Resp 20    LMP 01/04/2020    SpO2 100%   Physical Exam Constitutional:      General: She is not in acute distress.    Appearance: Normal appearance. She is well-developed. She is obese. She is not ill-appearing, toxic-appearing or diaphoretic.  HENT:     Head: Normocephalic and atraumatic.     Nose: Nose normal.     Mouth/Throat:     Mouth: Mucous membranes are moist.     Pharynx: Oropharynx is clear.  Eyes:     General: No scleral icterus.       Right eye: No discharge.        Left eye: No discharge.     Extraocular Movements: Extraocular movements intact.     Conjunctiva/sclera: Conjunctivae normal.     Pupils: Pupils are equal, round, and reactive to light.  Cardiovascular:     Rate and Rhythm: Normal rate.  Pulmonary:     Effort: Pulmonary effort is normal.  Skin:    General: Skin is warm and dry.  Neurological:     General: No focal deficit present.     Mental Status: She is alert and oriented to person, place, and time.     Motor: No weakness.     Coordination: Coordination normal.     Gait: Gait normal.     Deep Tendon Reflexes:  Reflexes normal.  Psychiatric:        Mood and Affect: Mood normal.        Behavior: Behavior normal.  Thought Content: Thought content normal.        Judgment: Judgment normal.       Assessment and Plan :   I have reviewed the PDMP during this encounter.  1. Right hip pain   2. Chronic pain syndrome   3. History of right hip replacement     Patient has acute on chronic right hip pain likely related to history of hip replacement and overuse.  Recommended scheduling ibuprofen, muscle relaxant.  For breakthrough pain, can use hydrocodone. Counseled patient on potential for adverse effects with medications prescribed/recommended today, ER and return-to-clinic precautions discussed, patient verbalized understanding.    Wallis Bamberg, [place] 02/01/20 1631

## 2020-02-28 ENCOUNTER — Ambulatory Visit (HOSPITAL_COMMUNITY)
Admission: RE | Admit: 2020-02-28 | Discharge: 2020-02-28 | Disposition: A | Discharge status: Home / Self Care | Payer: Medicaid Other | Source: Ambulatory Visit | Attending: Internal Medicine | Admitting: Internal Medicine

## 2020-02-28 ENCOUNTER — Other Ambulatory Visit: Payer: Self-pay

## 2020-02-28 ENCOUNTER — Encounter (HOSPITAL_COMMUNITY): Payer: Self-pay

## 2020-02-28 VITALS — BP 115/81 | HR 89 | Temp 98.7°F | Resp 18

## 2020-02-28 DIAGNOSIS — Z791 Long term (current) use of non-steroidal anti-inflammatories (NSAID): Secondary | ICD-10-CM | POA: Insufficient documentation

## 2020-02-28 DIAGNOSIS — F17210 Nicotine dependence, cigarettes, uncomplicated: Secondary | ICD-10-CM | POA: Diagnosis not present

## 2020-02-28 DIAGNOSIS — J029 Acute pharyngitis, unspecified: Secondary | ICD-10-CM

## 2020-02-28 DIAGNOSIS — Z79899 Other long term (current) drug therapy: Secondary | ICD-10-CM | POA: Diagnosis not present

## 2020-02-28 DIAGNOSIS — Z885 Allergy status to narcotic agent status: Secondary | ICD-10-CM | POA: Insufficient documentation

## 2020-02-28 DIAGNOSIS — Z880 Allergy status to penicillin: Secondary | ICD-10-CM | POA: Insufficient documentation

## 2020-02-28 DIAGNOSIS — Z20822 Contact with and (suspected) exposure to covid-19: Secondary | ICD-10-CM | POA: Insufficient documentation

## 2020-02-28 DIAGNOSIS — Z888 Allergy status to other drugs, medicaments and biological substances status: Secondary | ICD-10-CM | POA: Insufficient documentation

## 2020-02-28 DIAGNOSIS — Z7984 Long term (current) use of oral hypoglycemic drugs: Secondary | ICD-10-CM | POA: Insufficient documentation

## 2020-02-28 LAB — SARS CORONAVIRUS 2 (TAT 6-24 HRS): SARS Coronavirus 2: NEGATIVE

## 2020-02-28 LAB — POCT RAPID STREP A, ED / UC: Streptococcus, Group A Screen (Direct): NEGATIVE

## 2020-02-28 MED ORDER — AMOXICILLIN 500 MG PO CAPS: 500.0000 mg | ORAL_CAPSULE | Freq: Two times a day (BID) | ORAL | 0 refills | Status: AC

## 2020-02-28 MED ORDER — IBUPROFEN 800 MG PO TABS: 800.0000 mg | ORAL_TABLET | Freq: Three times a day (TID) | ORAL | 0 refills | Status: DC

## 2020-02-28 NOTE — ED Triage Notes (Signed)
Symptoms started 2 days ago.  Sore throat and fever (reported as 102), neck feels swollen.  Patient thinks she has strep  Patient has been taking ibuprofen

## 2020-02-28 NOTE — ED Provider Notes (Signed)
[facility]    CSN: [id number] Arrival date & time: 02/28/20  1056      History   Chief Complaint Chief Complaint  Patient presents with  . Sore Throat    HPI Theresa Mckay is a 39 y.o. female presenting today for evaluation of sore throat.  Reports sore throat began approximately 2 to 3 days ago.  Has had fevers up to 102 and has had felt some stiffness and swelling within her neck.  Feels similar to prior strep.  Using ibuprofen with temporary relief.  Denies any cough.  Has had some mild congestion.  Denies close sick contacts.  HPI  Past Medical History:  Diagnosis Date  . Anxiety   . Arthritis    "hip, left knee, hands" (08/06/2016)  . Avascular necrosis (HCC)    "hips" (08/06/2016)  . Bipolar 1 disorder (HCC)   . Childhood asthma   . Chronic back pain    "all over my back" (08/06/2016)  . Chronic bronchitis (HCC)   . DDD (degenerative disc disease), cervical   . Depression   . Hip pain   . Pneumonia 2015  . PTSD (post-traumatic stress disorder) dx'd 2012  . Renal disorder   . Seizures (HCC)    "non epilleptic; not often; usually when they're changing my seizure RX around" (08/06/2016)    Patient Active Problem List   Diagnosis Date Noted  . Arthritis of right hip 08/06/2016  . Avascular necrosis of hip, right (HCC) 08/01/2016  . Chronic posttraumatic stress disorder 06/19/2013  . MDD (major depressive disorder) 06/18/2013  . MDD (major depressive disorder), recurrent episode, severe (HCC) 06/17/2013  . Homicidal ideation 06/17/2013    Past Surgical History:  Procedure Laterality Date  . DILATION AND CURETTAGE OF UTERUS    . JOINT REPLACEMENT    . TOTAL HIP ARTHROPLASTY Right 08/06/2016  . TOTAL HIP ARTHROPLASTY Right 08/06/2016   Procedure: TOTAL HIP ARTHROPLASTY ANTERIOR APPROACH;  Surgeon: Gean Birchwood, MD;  Location: [facility];  Service: Orthopedics;  Laterality: Right;  . WISDOM TOOTH EXTRACTION      OB History   No obstetric history on  file.      Home Medications    Prior to Admission medications   Medication Sig Start Date End Date Taking? Authorizing Provider  busPIRone (BUSPAR) 15 MG tablet Take 15 mg by mouth daily. 04/23/19  Yes [provider]  amoxicillin (AMOXIL) 500 MG capsule Take 1 capsule (500 mg total) by mouth 2 (two) times daily for 10 days. 02/28/20 03/09/20  Farrah Skoda C, PA-C  ARIPiprazole (ABILIFY) 15 MG tablet Take 15 mg by mouth daily. 04/12/19   [provider]  ibuprofen (ADVIL) 800 MG tablet Take 1 tablet (800 mg total) by mouth 3 (three) times daily. 02/28/20   Dheeraj Hail C, PA-C  lisdexamfetamine (VYVANSE) 60 MG capsule Take by mouth.    [provider]  diphenhydrAMINE (SOMINEX) 25 MG tablet Take by mouth.  02/28/20  [provider]  fluticasone (FLONASE) 50 MCG/ACT nasal spray Place 1-2 sprays into both nostrils daily for 7 days. 11/25/19 02/28/20  Teliah Buffalo C, PA-C  lamoTRIgine (LAMICTAL) 200 MG tablet Take by mouth.  02/28/20  [provider]  metFORMIN (GLUCOPHAGE) 500 MG tablet Take 500 mg by mouth 2 (two) times daily. 11/22/16 12/11/18  [provider]  misoprostol (CYTOTEC) 100 MCG tablet Place vaginally. 04/21/13 02/28/20  [provider]    Family History Family History  Problem Relation Age of Onset  .  Hypertension Mother   . Bipolar disorder Father   . Aneurysm Father   . Obesity Sister   . Breast cancer Sister   . Cervical cancer Sister   . Colon cancer Sister   . Bipolar disorder Sister   . Diabetes Other     Social History Social History   Tobacco Use  . Smoking status: Current Every Day Smoker    Packs/day: 1.00    Years: 18.00    Pack years: 18.00    Types: Cigarettes  . Smokeless tobacco: Never Used  Vaping Use  . Vaping Use: Never used  Substance Use Topics  . Alcohol use: Yes    Comment: 08/06/2016 "drank some in college; nothing since 10/18/2015"  . Drug use: Never     Allergies     Latex, Olanzapine, Amoxicillin-pot clavulanate, Gabapentin, Tramadol, and Other   Review of Systems Review of Systems  Constitutional: Negative for activity change, appetite change, chills, fatigue and fever.  HENT: Positive for congestion and sore throat. Negative for ear pain, rhinorrhea, sinus pressure and trouble swallowing.   Eyes: Negative for discharge and redness.  Respiratory: Negative for cough, chest tightness and shortness of breath.   Cardiovascular: Negative for chest pain.  Gastrointestinal: Negative for abdominal pain, diarrhea, nausea and vomiting.  Musculoskeletal: Negative for myalgias.  Skin: Negative for rash.  Neurological: Positive for headaches. Negative for dizziness and light-headedness.     Physical Exam Triage Vital Signs ED Triage Vitals  Enc Vitals Group     BP 02/28/20 1218 115/81     Pulse Rate 02/28/20 1218 89     Resp 02/28/20 1218 18     Temp 02/28/20 1218 98.7 F (37.1 C)     Temp Source 02/28/20 1218 Oral     SpO2 02/28/20 1218 99 %     Weight --      Height --      Head Circumference --      Peak Flow --      Pain Score 02/28/20 1212 8     Pain Loc --      Pain Edu? --      Excl. in GC? --    No data found.  Updated Vital Signs BP 115/81 (BP Location: Right Arm) Comment (BP Location): large cuff  Pulse 89   Temp 98.7 F (37.1 C) (Oral) Comment: ibuprofen 10:30 am  Resp 18   LMP 02/28/2020   SpO2 99%   Visual Acuity Right Eye Distance:   Left Eye Distance:   Bilateral Distance:    Right Eye Near:   Left Eye Near:    Bilateral Near:     Physical Exam Vitals and nursing note reviewed.  Constitutional:      Appearance: She is well-developed.     Comments: No acute distress  HENT:     Head: Normocephalic and atraumatic.     Ears:     Comments: Bilateral ears without tenderness to palpation of external auricle, tragus and mastoid, EAC's without erythema or swelling, TM's with good bony landmarks and cone of light. Non  erythematous.     Nose: Nose normal.     Mouth/Throat:     Comments: Oral mucosa pink and moist, tonsillar area and uvula appear with deep erythema, no exudate or swelling, posterior pharynx patent and nonerythematous, no uvula deviation or swelling. Normal phonation. Eyes:     Conjunctiva/sclera: Conjunctivae normal.  Neck:     Comments: Bilateral tonsillar lymphadenopathy, no obvious neck swelling or  erythema, full active range of motion of neck, and no nuchal rigidity Cardiovascular:     Rate and Rhythm: Normal rate.  Pulmonary:     Effort: Pulmonary effort is normal. No respiratory distress.     Comments: Breathing comfortably at rest, CTABL, no wheezing, rales or other adventitious sounds auscultated Abdominal:     General: There is no distension.  Musculoskeletal:        General: Normal range of motion.     Cervical back: Neck supple.  Skin:    General: Skin is warm and dry.  Neurological:     Mental Status: She is alert and oriented to person, place, and time.      UC Treatments / Results  Labs (all labs ordered are listed, but only abnormal results are displayed) Labs Reviewed  CULTURE, GROUP A STREP ([facility])  SARS CORONAVIRUS 2 (TAT 6-24 HRS)  POCT RAPID STREP A, ED / UC    EKG   Radiology No results found.  Procedures Procedures (including critical care time)  Medications Ordered in UC Medications - No data to display  Initial Impression / Assessment and Plan / UC Course  I have reviewed the triage vital signs and the nursing notes.  Pertinent labs & imaging results that were available during my care of the patient were reviewed by me and considered in my medical decision making (see chart for details).     Strep test negative, Covid test pending.  Given deep erythema of throat along with associated tonsillar lymphadenopathy and reported fevers opting to go ahead and cover for other bacterial causes of pharyngitis with amoxicillin and  anti-inflammatories.  Monitor for gradual resolution.  Follow-up if any symptoms progressing or worsening.  No nuchal rigidity.  No sign of deep space abscess or soft palate swelling. Discussed strict return precautions. Patient verbalized understanding and is agreeable with plan.   Final Clinical Impressions(s) / UC Diagnoses   Final diagnoses:  Pharyngitis, unspecified etiology     Discharge Instructions     Begin amoxicillin twice daily for 10 days Use anti-inflammatories for pain/swelling. You may take up to 800 mg Ibuprofen every 8 hours with food. You may supplement Ibuprofen with Tylenol [phone] mg every 8 hours.  Honey and hot tea Daily cetirizine/zyrtec or loratadine/claritin for relief of congestion/post nasal drainage    ED Prescriptions    Medication Sig Dispense Auth. Provider   amoxicillin (AMOXIL) 500 MG capsule Take 1 capsule (500 mg total) by mouth 2 (two) times daily for 10 days. 20 capsule Dquan Cortopassi C, PA-C   ibuprofen (ADVIL) 800 MG tablet Take 1 tablet (800 mg total) by mouth 3 (three) times daily. 21 tablet Masie Bermingham, Hartrandt C, PA-C     PDMP not reviewed this encounter.   Nimo Verastegui, Cameron C, PA-C 02/28/20 1254

## 2020-02-28 NOTE — Discharge Instructions (Signed)
Begin amoxicillin twice daily for 10 days Use anti-inflammatories for pain/swelling. You may take up to 800 mg Ibuprofen every 8 hours with food. You may supplement Ibuprofen with Tylenol [phone] mg every 8 hours.  Honey and hot tea Daily cetirizine/zyrtec or loratadine/claritin for relief of congestion/post nasal drainage

## 2020-03-01 LAB — CULTURE, GROUP A STREP (THRC)

## 2020-07-02 ENCOUNTER — Ambulatory Visit (HOSPITAL_COMMUNITY)
Admission: RE | Admit: 2020-07-02 | Discharge: 2020-07-02 | Disposition: A | Discharge status: Home / Self Care | Payer: Medicaid Other | Source: Ambulatory Visit | Attending: Internal Medicine | Admitting: Internal Medicine

## 2020-07-02 ENCOUNTER — Other Ambulatory Visit: Payer: Self-pay

## 2020-07-02 DIAGNOSIS — Z20822 Contact with and (suspected) exposure to covid-19: Secondary | ICD-10-CM | POA: Insufficient documentation

## 2020-07-02 DIAGNOSIS — Z1152 Encounter for screening for COVID-19: Secondary | ICD-10-CM | POA: Diagnosis not present

## 2020-07-02 NOTE — Discharge Instructions (Signed)
If your Covid-19 test is positive, you will get a phone call from [facility] regarding your results. If your Covid-19 test is negative, you will NOT get a phone call from [facility] with your results. You may view your results on MyChart. If you do not have a MyChart account, sign up instructions are in your discharge papers. ° °

## 2020-07-02 NOTE — ED Triage Notes (Signed)
Pt presents for covid testing after a family member recently tested positive.

## 2020-07-03 LAB — SARS CORONAVIRUS 2 (TAT 6-24 HRS): SARS Coronavirus 2: NEGATIVE

## 2020-07-22 ENCOUNTER — Ambulatory Visit (HOSPITAL_COMMUNITY)
Admission: RE | Admit: 2020-07-22 | Discharge: 2020-07-22 | Disposition: A | Discharge status: Home / Self Care | Payer: Medicaid Other | Source: Ambulatory Visit

## 2020-07-22 ENCOUNTER — Encounter (HOSPITAL_COMMUNITY): Payer: Self-pay

## 2020-07-22 ENCOUNTER — Other Ambulatory Visit: Payer: Self-pay

## 2020-07-22 VITALS — BP 130/94 | HR 87 | Temp 99.1°F | Resp 18

## 2020-07-22 DIAGNOSIS — R062 Wheezing: Secondary | ICD-10-CM

## 2020-07-22 DIAGNOSIS — J3089 Other allergic rhinitis: Secondary | ICD-10-CM

## 2020-07-22 DIAGNOSIS — J4521 Mild intermittent asthma with (acute) exacerbation: Secondary | ICD-10-CM

## 2020-07-22 MED ORDER — CETIRIZINE HCL 10 MG PO TABS: 10.0000 mg | ORAL_TABLET | Freq: Every day | ORAL | 2 refills | Status: DC

## 2020-07-22 MED ORDER — FLUTICASONE PROPIONATE 50 MCG/ACT NA SUSP: 1.0000 | Freq: Two times a day (BID) | NASAL | 2 refills | Status: DC | PRN

## 2020-07-22 MED ORDER — BUDESONIDE-FORMOTEROL FUMARATE 160-4.5 MCG/ACT IN AERO: 2.0000 | INHALATION_SPRAY | Freq: Two times a day (BID) | RESPIRATORY_TRACT | 1 refills | Status: DC

## 2020-07-22 NOTE — ED Triage Notes (Signed)
  Pt c/o runny nose,mild shortness of breath and cough. Pt is concerned she needs abx and inhaler due to having needed these in the past due to drainage "settling in lungs". Reports history of asthmatic bronchitis and pneumonia.

## 2020-07-23 ENCOUNTER — Ambulatory Visit (HOSPITAL_COMMUNITY): Payer: Self-pay

## 2020-07-24 ENCOUNTER — Ambulatory Visit (HOSPITAL_COMMUNITY)
Admission: RE | Admit: 2020-07-24 | Discharge: 2020-07-24 | Disposition: A | Discharge status: Home / Self Care | Payer: Medicaid Other | Source: Ambulatory Visit | Attending: Physician Assistant | Admitting: Physician Assistant

## 2020-07-24 ENCOUNTER — Ambulatory Visit (INDEPENDENT_AMBULATORY_CARE_PROVIDER_SITE_OTHER): Payer: Medicaid Other

## 2020-07-24 ENCOUNTER — Encounter (HOSPITAL_COMMUNITY): Payer: Self-pay

## 2020-07-24 ENCOUNTER — Other Ambulatory Visit: Payer: Self-pay

## 2020-07-24 VITALS — BP 126/84 | HR 86 | Temp 98.3°F | Resp 18

## 2020-07-24 DIAGNOSIS — R059 Cough, unspecified: Secondary | ICD-10-CM

## 2020-07-24 DIAGNOSIS — J441 Chronic obstructive pulmonary disease with (acute) exacerbation: Secondary | ICD-10-CM

## 2020-07-24 DIAGNOSIS — R058 Other specified cough: Secondary | ICD-10-CM

## 2020-07-24 DIAGNOSIS — R0602 Shortness of breath: Secondary | ICD-10-CM | POA: Diagnosis not present

## 2020-07-24 DIAGNOSIS — R062 Wheezing: Secondary | ICD-10-CM | POA: Diagnosis not present

## 2020-07-24 HISTORY — DX: Abnormal levels of other serum enzymes: R74.8

## 2020-07-24 MED ORDER — DOXYCYCLINE HYCLATE 100 MG PO CAPS: 100.0000 mg | ORAL_CAPSULE | Freq: Two times a day (BID) | ORAL | 0 refills | Status: DC

## 2020-07-24 NOTE — ED Provider Notes (Signed)
[facility]    CSN: [id number] Arrival date & time: 07/24/20  1153      History   Chief Complaint Chief Complaint  Patient presents with  . Cough  . Appointment @ 1200    HPI Theresa Mckay is a 40 y.o. female.   Patient presents today with a 5-day history of worsening productive cough.  She was seen 07/22/2020 at which point she was given Symbicort, Zyrtec, intranasal steroids which have not provided any relief of symptoms.  He reports typically getting similar symptoms 1-2 times per year requiring antibiotics for resolution of symptoms.  She denies any known sick contacts.  Denies any recent antibiotic use.  She has not had a flu shot but has up-to-date on COVID-19 vaccine but has not yet had booster.  She reports associated sinus pressure and drainage but denies any chest pain, fever, nausea, vomiting, dizziness, syncope.  She has tried over-the-counter antihistamines as well as prescribed medication without any improvement of symptoms.  She has a history of asthma and/or COPD (patient is unsure which condition she has been officially diagnosed with).  She is a current everyday smoker smoking approximately 0.5 packs/day.  Symptoms are interfering with ability perform daily activities     Past Medical History:  Diagnosis Date  . Anxiety   . Arthritis    "hip, left knee, hands" (08/06/2016)  . Avascular necrosis (HCC)    "hips" (08/06/2016)  . Bipolar 1 disorder (HCC)   . Childhood asthma   . Chronic back pain    "all over my back" (08/06/2016)  . Chronic bronchitis (HCC)   . DDD (degenerative disc disease), cervical   . Depression   . Elevated liver enzymes   . Hip pain   . Pneumonia 2015  . Pneumonia   . PTSD (post-traumatic stress disorder) dx'd 2012  . Seizures (HCC)    "non epilleptic; not often; usually when they're changing my seizure RX around" (08/06/2016)    Patient Active Problem List   Diagnosis Date Noted  . Arthritis of right hip 08/06/2016  .  Avascular necrosis of hip, right (HCC) 08/01/2016  . Chronic posttraumatic stress disorder 06/19/2013  . MDD (major depressive disorder) 06/18/2013  . MDD (major depressive disorder), recurrent episode, severe (HCC) 06/17/2013  . Homicidal ideation 06/17/2013    Past Surgical History:  Procedure Laterality Date  . DILATION AND CURETTAGE OF UTERUS    . JOINT REPLACEMENT    . TOTAL HIP ARTHROPLASTY Right 08/06/2016   Procedure: TOTAL HIP ARTHROPLASTY ANTERIOR APPROACH;  Surgeon: Gean Birchwood, MD;  Location: [facility];  Service: Orthopedics;  Laterality: Right;  . WISDOM TOOTH EXTRACTION      OB History   No obstetric history on file.      Home Medications    Prior to Admission medications   Medication Sig Start Date End Date Taking? Authorizing Provider  ARIPiprazole (ABILIFY) 15 MG tablet Take 15 mg by mouth daily. 04/12/19  Yes [provider]  budesonide-formoterol (SYMBICORT) 160-4.5 MCG/ACT inhaler Inhale 2 puffs into the lungs 2 (two) times daily. 07/22/20  Yes Particia Nearing, PA-C  busPIRone (BUSPAR) 15 MG tablet Take 15 mg by mouth daily. 04/23/19  Yes [provider]  cetirizine (ZYRTEC ALLERGY) 10 MG tablet Take 1 tablet (10 mg total) by mouth daily. 07/22/20  Yes Particia Nearing, PA-C  doxycycline (VIBRAMYCIN) 100 MG capsule Take 1 capsule (100 mg total) by mouth 2 (two) times daily. 07/24/20  Yes Devetta Hagenow, Noberto Retort, PA-C  fluticasone (FLONASE) 50 MCG/ACT nasal spray Place 1 spray into both nostrils 2 (two) times daily as needed for allergies or rhinitis. 07/22/20  Yes Particia Nearing, PA-C  lisdexamfetamine (VYVANSE) 60 MG capsule Take by mouth.   Yes [provider]  ibuprofen (ADVIL) 800 MG tablet Take 1 tablet (800 mg total) by mouth 3 (three) times daily. 02/28/20   Wieters, Hallie C, PA-C  pseudoephedrine (SUDAFED) 60 MG tablet Take 60 mg by mouth every 4 (four) hours as needed for congestion.    [provider]  diphenhydrAMINE  (SOMINEX) 25 MG tablet Take by mouth.  02/28/20  [provider]  lamoTRIgine (LAMICTAL) 200 MG tablet Take by mouth.  02/28/20  [provider]  metFORMIN (GLUCOPHAGE) 500 MG tablet Take 500 mg by mouth 2 (two) times daily. 11/22/16 12/11/18  [provider]  misoprostol (CYTOTEC) 100 MCG tablet Place vaginally. 04/21/13 02/28/20  [provider]    Family History Family History  Problem Relation Age of Onset  . Hypertension Mother   . Bipolar disorder Father   . Aneurysm Father   . Obesity Sister   . Breast cancer Sister   . Cervical cancer Sister   . Colon cancer Sister   . Bipolar disorder Sister   . Diabetes Other     Social History Social History   Tobacco Use  . Smoking status: Current Every Day Smoker    Packs/day: 0.50    Years: 18.00    Pack years: 9.00    Types: Cigarettes  . Smokeless tobacco: Never Used  Vaping Use  . Vaping Use: Never used  Substance Use Topics  . Alcohol use: Yes    Comment: occasionally  . Drug use: Never     Allergies   Latex, Olanzapine, Amoxicillin-pot clavulanate, Gabapentin, Tramadol, and Other   Review of Systems Review of Systems  Constitutional: Negative for activity change, appetite change, fatigue and fever.  HENT: Positive for congestion and sinus pressure. Negative for sneezing and sore throat.   Respiratory: Positive for cough and shortness of breath.   Cardiovascular: Negative for chest pain.  Gastrointestinal: Negative for abdominal pain, diarrhea, nausea and vomiting.  Musculoskeletal: Negative for arthralgias and myalgias.  Neurological: Positive for headaches. Negative for dizziness and light-headedness.     Physical Exam Triage Vital Signs ED Triage Vitals  Enc Vitals Group     BP 07/24/20 1213 126/84     Pulse Rate 07/24/20 1213 86     Resp 07/24/20 1213 18     Temp 07/24/20 1213 98.3 F (36.8 C)     Temp Source 07/24/20 1213 Oral     SpO2 07/24/20 1213 98 %     Weight  --      Height --      Head Circumference --      Peak Flow --      Pain Score 07/24/20 1214 4     Pain Loc --      Pain Edu? --      Excl. in GC? --    No data found.  Updated Vital Signs BP 126/84   Pulse 86   Temp 98.3 F (36.8 C) (Oral)   Resp 18   LMP 07/17/2020   SpO2 98%   Visual Acuity Right Eye Distance:   Left Eye Distance:   Bilateral Distance:    Right Eye Near:   Left Eye Near:    Bilateral Near:     Physical Exam Vitals reviewed.  Constitutional:      General: She is awake. She is not in acute distress.    Appearance: Normal appearance. She is not ill-appearing.     Comments: Very pleasant female appears stated age in no acute distress  HENT:     Head: Normocephalic and atraumatic.     Right Ear: Tympanic membrane, ear canal and external ear normal. Tympanic membrane is not erythematous or bulging.     Left Ear: Tympanic membrane, ear canal and external ear normal. Tympanic membrane is not erythematous or bulging.     Nose:     Right Sinus: No maxillary sinus tenderness or frontal sinus tenderness.     Left Sinus: No maxillary sinus tenderness or frontal sinus tenderness.     Mouth/Throat:     Pharynx: Uvula midline. No oropharyngeal exudate or posterior oropharyngeal erythema.     Comments: Drainage present posterior oropharynx Cardiovascular:     Rate and Rhythm: Normal rate and regular rhythm.     Heart sounds: No murmur heard.   Pulmonary:     Effort: Pulmonary effort is normal.     Breath sounds: Rhonchi present. No wheezing or rales.     Comments: Scattered rhonchi throughout lung fields partially clear with cough Musculoskeletal:     Right lower leg: No edema.     Left lower leg: No edema.  Lymphadenopathy:     Head:     Right side of head: No submental, submandibular or tonsillar adenopathy.     Left side of head: No submental, submandibular or tonsillar adenopathy.     Cervical: No cervical adenopathy.  Psychiatric:         Behavior: Behavior is cooperative.      UC Treatments / Results  Labs (all labs ordered are listed, but only abnormal results are displayed) Labs Reviewed - No data to display  EKG   Radiology DG Chest 2 View  Result Date: 07/24/2020 CLINICAL DATA:  Shortness of breath with cough and wheezing EXAM: CHEST - 2 VIEW COMPARISON:  None FINDINGS: The lungs are clear. The heart size and pulmonary vascularity are normal. No adenopathy. No pneumothorax. No bone lesions. IMPRESSION: Lungs clear.  Cardiac silhouette normal. Electronically Signed   By: Bretta Bang III M.D.   On: 07/24/2020 13:52    Procedures Procedures (including critical care time)  Medications Ordered in UC Medications - No data to display  Initial Impression / Assessment and Plan / UC Course  I have reviewed the triage vital signs and the nursing notes.  Pertinent labs & imaging results that were available during my care of the patient were reviewed by me and considered in my medical decision making (see chart for details).     Chest x-ray obtained showed no acute findings.  Given worsening cough with adventitious lung sounds, patient was started on doxycycline to cover for COPD exacerbation.  She was instructed to remain out of the sun while on this medication due to photosensitivity associated with this medication.  She should continue previously prescribed antihistamines and Symbicort to manage additional symptoms.  Strict return precautions given to which patient expressed understanding.  Final Clinical Impressions(s) / UC Diagnoses   Final diagnoses:  Productive cough  COPD exacerbation (HCC)     Discharge Instructions     Take doxycycline twice a day for 10 days.  Please stay out of the sun while on this medication as it caused you to be sensitive to the sun.  I would continue the other  medications as previously prescribed.  If you have any worsening cough, fever, nausea, vomiting you need to be seen  emergently.  Please follow-up with your PCP.    ED Prescriptions    Medication Sig Dispense Auth. Provider   doxycycline (VIBRAMYCIN) 100 MG capsule Take 1 capsule (100 mg total) by mouth 2 (two) times daily. 20 capsule Jazen Spraggins, Noberto RetortErin K, PA-C     PDMP not reviewed this encounter.   Jeani HawkingRaspet, Giancarlos Berendt K, PA-C 07/24/20 1402

## 2020-07-24 NOTE — Discharge Instructions (Addendum)
Take doxycycline twice a day for 10 days.  Please stay out of the sun while on this medication as it caused you to be sensitive to the sun.  I would continue the other medications as previously prescribed.  If you have any worsening cough, fever, nausea, vomiting you need to be seen emergently.  Please follow-up with your PCP.

## 2020-07-24 NOTE — ED Triage Notes (Signed)
C/O cough and postnasal drip onset approx 4 days ago.  Pt states she feels she has tactile fever.  Was seen in [facility] 2 days ago - given zyrtec, flonase, symbicort without any relief.

## 2020-07-28 ENCOUNTER — Other Ambulatory Visit: Payer: Self-pay

## 2020-07-28 ENCOUNTER — Encounter (HOSPITAL_COMMUNITY): Payer: Self-pay

## 2020-07-28 ENCOUNTER — Ambulatory Visit (HOSPITAL_COMMUNITY)
Admission: RE | Admit: 2020-07-28 | Discharge: 2020-07-28 | Disposition: A | Discharge status: Home / Self Care | Payer: Medicaid Other | Source: Ambulatory Visit | Attending: Physician Assistant | Admitting: Physician Assistant

## 2020-07-28 VITALS — BP 119/82 | HR 98 | Temp 99.0°F | Resp 19

## 2020-07-28 DIAGNOSIS — M545 Low back pain, unspecified: Secondary | ICD-10-CM | POA: Diagnosis not present

## 2020-07-28 MED ORDER — HYDROCODONE-ACETAMINOPHEN 5-325 MG PO TABS: 2.0000 | ORAL_TABLET | Freq: Two times a day (BID) | ORAL | 0 refills | Status: DC | PRN

## 2020-07-28 MED ORDER — TIZANIDINE HCL 4 MG PO TABS: 4.0000 mg | ORAL_TABLET | Freq: Three times a day (TID) | ORAL | 0 refills | Status: DC | PRN

## 2020-07-28 NOTE — Discharge Instructions (Signed)
Who prescribed tizanidine to be used up to twice a day as needed.  This is a sedating medication sectional drive or drink alcohol with it.  I also prescribed to you #5 hydrocodone.  You should not take these medications together as they are both sedating.  Please use heat and stretch for additional symptom relief.  If symptoms persist please come back for reevaluation.

## 2020-07-28 NOTE — ED Provider Notes (Signed)
[facility]    CSN: [id number] Arrival date & time: 07/28/20  1455      History   Chief Complaint Chief Complaint  Patient presents with  . Appointment    1500  . Back Pain  . Hip Pain    HPI Theresa Mckay is a 40 y.o. female.   Patient presents today with a 2-day history of recurrent lumbar back pain.  She was seen by our clinic 07/24/2020 with worsening cough and prescribed doxycycline.  As result of illness, she spent several days in bed and then returned to work yesterday which exacerbated symptoms.  She reports cough symptoms resolved with antibiotic but she is now experiencing lumbar back pain.  Pain is rated 9 with certain movements, localized to lumbar back with it worse on right compared to left, described as aching with periodic sharp pains, worse with certain movements, no alleviating factors identified.  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  This is her typical pain and she generally requires muscle relaxers and hydrocodone for pain relief.  She has tried Tylenol without improvement of symptoms.  She did take one dose of muscle relaxer but this has not provided any relief.  She denies any bowel/bladder incontinence, lower extremity weakness, paresthesias, saddle anesthesia.     Past Medical History:  Diagnosis Date  . Anxiety   . Arthritis    "hip, left knee, hands" (08/06/2016)  . Avascular necrosis (HCC)    "hips" (08/06/2016)  . Bipolar 1 disorder (HCC)   . Childhood asthma   . Chronic back pain    "all over my back" (08/06/2016)  . Chronic bronchitis (HCC)   . DDD (degenerative disc disease), cervical   . Depression   . Elevated liver enzymes   . Hip pain   . Pneumonia 2015  . Pneumonia   . PTSD (post-traumatic stress disorder) dx'd 2012  . Seizures (HCC)    "non epilleptic; not  often; usually when they're changing my seizure RX around" (08/06/2016)    Patient Active Problem List   Diagnosis Date Noted  . Arthritis of right hip 08/06/2016  . Avascular necrosis of hip, right (HCC) 08/01/2016  . Chronic posttraumatic stress disorder 06/19/2013  . MDD (major depressive disorder) 06/18/2013  . MDD (major depressive disorder), recurrent episode, severe (HCC) 06/17/2013  . Homicidal ideation 06/17/2013    Past Surgical History:  Procedure Laterality Date  . DILATION AND CURETTAGE OF UTERUS    . JOINT REPLACEMENT    . TOTAL HIP ARTHROPLASTY Right 08/06/2016   Procedure: TOTAL HIP ARTHROPLASTY ANTERIOR APPROACH;  Surgeon: Gean BirchwoodFrank Rowan, MD;  Location: [facility];  Service: Orthopedics;  Laterality: Right;  . WISDOM TOOTH EXTRACTION      OB History   No obstetric history on file.      Home Medications    Prior to Admission medications   Medication Sig Start Date End Date Taking? Authorizing Provider  HYDROcodone-acetaminophen (NORCO/VICODIN) 5-325 MG tablet Take 2 tablets by mouth 2 (two) times daily as needed for up to 5 doses for severe pain. 07/28/20  Yes Millan Legan K, PA-C  tiZANidine (ZANAFLEX) 4 MG tablet Take 1 tablet (4 mg total) by mouth every 8 (eight) hours as needed for muscle spasms. 07/28/20  Yes Loida Calamia K, PA-C  ARIPiprazole (ABILIFY) 15 MG tablet Take 15 mg by  mouth daily. 04/12/19   [provider]  budesonide-formoterol (SYMBICORT) 160-4.5 MCG/ACT inhaler Inhale 2 puffs into the lungs 2 (two) times daily. 07/22/20   Particia Nearing, PA-C  busPIRone (BUSPAR) 15 MG tablet Take 15 mg by mouth daily. 04/23/19   [provider]  cetirizine (ZYRTEC ALLERGY) 10 MG tablet Take 1 tablet (10 mg total) by mouth daily. 07/22/20   Particia Nearing, PA-C  doxycycline (VIBRAMYCIN) 100 MG capsule Take 1 capsule (100 mg total) by mouth 2 (two) times daily. 07/24/20   Fidelia Cathers, Noberto Retort, PA-C  fluticasone (FLONASE) 50 MCG/ACT nasal spray Place 1  spray into both nostrils 2 (two) times daily as needed for allergies or rhinitis. 07/22/20   Particia Nearing, PA-C  ibuprofen (ADVIL) 800 MG tablet Take 1 tablet (800 mg total) by mouth 3 (three) times daily. 02/28/20   Wieters, Hallie C, PA-C  lisdexamfetamine (VYVANSE) 60 MG capsule Take by mouth.    [provider]  pseudoephedrine (SUDAFED) 60 MG tablet Take 60 mg by mouth every 4 (four) hours as needed for congestion.    [provider]  diphenhydrAMINE (SOMINEX) 25 MG tablet Take by mouth.  02/28/20  [provider]  lamoTRIgine (LAMICTAL) 200 MG tablet Take by mouth.  02/28/20  [provider]  metFORMIN (GLUCOPHAGE) 500 MG tablet Take 500 mg by mouth 2 (two) times daily. 11/22/16 12/11/18  [provider]  misoprostol (CYTOTEC) 100 MCG tablet Place vaginally. 04/21/13 02/28/20  [provider]    Family History Family History  Problem Relation Age of Onset  . Hypertension Mother   . Bipolar disorder Father   . Aneurysm Father   . Obesity Sister   . Breast cancer Sister   . Cervical cancer Sister   . Colon cancer Sister   . Bipolar disorder Sister   . Diabetes Other     Social History Social History   Tobacco Use  . Smoking status: Current Every Day Smoker    Packs/day: 0.50    Years: 18.00    Pack years: 9.00    Types: Cigarettes  . Smokeless tobacco: Never Used  Vaping Use  . Vaping Use: Never used  Substance Use Topics  . Alcohol use: Yes    Comment: occasionally  . Drug use: Never     Allergies   Latex, Olanzapine, Amoxicillin-pot clavulanate, Gabapentin, Tramadol, and Other   Review of Systems Review of Systems  Constitutional: Positive for activity change. Negative for appetite change, fatigue and fever.  Respiratory: Negative for cough and shortness of breath.   Cardiovascular: Negative for chest pain.  Gastrointestinal: Negative for abdominal pain, diarrhea, nausea and vomiting.  Musculoskeletal:  Positive for arthralgias and back pain. Negative for myalgias.  Neurological: Negative for dizziness, weakness, light-headedness, numbness and headaches.     Physical Exam Triage Vital Signs ED Triage Vitals  Enc Vitals Group     BP 07/28/20 1546 119/82     Pulse Rate 07/28/20 1546 98     Resp 07/28/20 1546 19     Temp 07/28/20 1546 99 F (37.2 C)     Temp Source 07/28/20 1546 Oral     SpO2 07/28/20 1546 97 %     Weight --      Height --      Head Circumference --      Peak Flow --      Pain Score 07/28/20 1545 7     Pain Loc --  Pain Edu? --      Excl. in GC? --    No data found.  Updated Vital Signs BP 119/82 (BP Location: Right Arm)   Pulse 98   Temp 99 F (37.2 C) (Oral)   Resp 19   LMP 07/17/2020   SpO2 97%   Visual Acuity Right Eye Distance:   Left Eye Distance:   Bilateral Distance:    Right Eye Near:   Left Eye Near:    Bilateral Near:     Physical Exam Vitals reviewed.  Constitutional:      General: She is awake. She is not in acute distress.    Appearance: Normal appearance. She is not ill-appearing.     Comments: Very pleasant female appears stated age in no acute distress  HENT:     Head: Normocephalic and atraumatic.  Cardiovascular:     Rate and Rhythm: Normal rate and regular rhythm.     Heart sounds: No murmur heard.   Pulmonary:     Effort: Pulmonary effort is normal.     Breath sounds: Normal breath sounds. No wheezing, rhonchi or rales.     Comments: Clear to auscultation bilaterally Musculoskeletal:     Cervical back: No tenderness or bony tenderness.     Thoracic back: No tenderness or bony tenderness.     Lumbar back: Tenderness present. No bony tenderness.     Comments: Back: No pain percussion of vertebrae.  Decreased range of motion with forward flexion and rotation.  Tenderness palpation of bilateral paraspinal muscles (right greater than left).  Psychiatric:        Behavior: Behavior is cooperative.      UC  Treatments / Results  Labs (all labs ordered are listed, but only abnormal results are displayed) Labs Reviewed - No data to display  EKG   Radiology No results found.  Procedures Procedures (including critical care time)  Medications Ordered in UC Medications - No data to display  Initial Impression / Assessment and Plan / UC Course  I have reviewed the triage vital signs and the nursing notes.  Pertinent labs & imaging results that were available during my care of the patient were reviewed by me and considered in my medical decision making (see chart for details).     No indication for x-ray given no alarm symptoms, bony tenderness, atraumatic injury.  Patient was prescribed tizanidine with instruction streptococcal this medication as her symptoms is a common side effect.  She was prescribed 5 doses of hydrocodone.  Review of [place] controlled substance database shows no inappropriate refills; last refill of hydrocodone with October 2021.  Discussed that we are unable provide any refills to which she expressed understanding.  She was provided a work excuse note.  She was encouraged to use heat, stretch, rest for symptom relief.  Strict return precautions given to which patient expressed understanding.  Final Clinical Impressions(s) / UC Diagnoses   Final diagnoses:  Acute bilateral low back pain without sciatica     Discharge Instructions     Who prescribed tizanidine to be used up to twice a day as needed.  This is a sedating medication sectional drive or drink alcohol with it.  I also prescribed to you #5 hydrocodone.  You should not take these medications together as they are both sedating.  Please use heat and stretch for additional symptom relief.  If symptoms persist please come back for reevaluation.    ED Prescriptions    Medication Sig Dispense  Auth. Provider   tiZANidine (ZANAFLEX) 4 MG tablet Take 1 tablet (4 mg total) by mouth every 8 (eight) hours as  needed for muscle spasms. 30 tablet Cierra Rothgeb K, PA-C   HYDROcodone-acetaminophen (NORCO/VICODIN) 5-325 MG tablet Take 2 tablets by mouth 2 (two) times daily as needed for up to 5 doses for severe pain. 5 tablet Kileen Lange K, PA-C     I have reviewed the PDMP during this encounter.   Jeani Hawking, PA-C 07/28/20 1617

## 2020-07-28 NOTE — ED Triage Notes (Signed)
Pt presents with lower back pain and hip pain x 1 day. Pain worsens when walking and bending over. Ibuprofen gives some relief. Denies dysuria, fall.

## 2020-08-16 ENCOUNTER — Encounter (HOSPITAL_COMMUNITY): Payer: Self-pay

## 2020-08-16 ENCOUNTER — Ambulatory Visit (HOSPITAL_COMMUNITY)
Admission: RE | Admit: 2020-08-16 | Discharge: 2020-08-16 | Disposition: A | Discharge status: Home / Self Care | Payer: Medicaid Other | Source: Ambulatory Visit | Attending: Emergency Medicine | Admitting: Emergency Medicine

## 2020-08-16 ENCOUNTER — Other Ambulatory Visit: Payer: Self-pay

## 2020-08-16 VITALS — BP 118/84 | HR 85 | Temp 98.1°F | Resp 20

## 2020-08-16 DIAGNOSIS — G8929 Other chronic pain: Secondary | ICD-10-CM | POA: Diagnosis not present

## 2020-08-16 DIAGNOSIS — M25551 Pain in right hip: Secondary | ICD-10-CM

## 2020-08-16 DIAGNOSIS — M25562 Pain in left knee: Secondary | ICD-10-CM | POA: Diagnosis not present

## 2020-08-16 MED ORDER — IBUPROFEN 800 MG PO TABS: 800.0000 mg | ORAL_TABLET | Freq: Three times a day (TID) | ORAL | 0 refills | Status: DC

## 2020-08-16 MED ORDER — HYDROCODONE-ACETAMINOPHEN 5-325 MG PO TABS: 2.0000 | ORAL_TABLET | Freq: Two times a day (BID) | ORAL | 0 refills | Status: AC | PRN

## 2020-08-16 NOTE — Discharge Instructions (Addendum)
Can use ibuprofen three times a day for pain  Can take 2 vicodin pills twice a day as needed for pain relief  Follow up with orthopedics for evaluation of hip and knee

## 2020-08-16 NOTE — ED Provider Notes (Signed)
[facility]    CSN: [id number] Arrival date & time: 08/16/20  1112      History   Chief Complaint Chief Complaint  Patient presents with  . Appointment  . Hip Pain  . Leg Pain    HPI Theresa Mckay is a 40 y.o. female.   Patient presents with right hip pain for about 1 month. Worsened with twisting, turning and bending. Denies numbness and tingling. Has chronic hip pain due to OA, history  total hip replacement. Seen 4/14 for similar complaint. Medication helped some but did not completely go away.  Present with left knee pain beginning over the last 2 weeks. Can hear and feel  popping and cracking when bending leg. Pain felt in posterior knee when leg straightened. Denies numbness or tingling. ROM intact, able to bear weight on leg.    Past Medical History:  Diagnosis Date  . Anxiety   . Arthritis    "hip, left knee, hands" (08/06/2016)  . Avascular necrosis (HCC)    "hips" (08/06/2016)  . Bipolar 1 disorder (HCC)   . Childhood asthma   . Chronic back pain    "all over my back" (08/06/2016)  . Chronic bronchitis (HCC)   . DDD (degenerative disc disease), cervical   . Depression   . Elevated liver enzymes   . Hip pain   . Pneumonia 2015  . Pneumonia   . PTSD (post-traumatic stress disorder) dx'd 2012  . Seizures (HCC)    "non epilleptic; not often; usually when they're changing my seizure RX around" (08/06/2016)    Patient Active Problem List   Diagnosis Date Noted  . Arthritis of right hip 08/06/2016  . Avascular necrosis of hip, right (HCC) 08/01/2016  . Chronic posttraumatic stress disorder 06/19/2013  . MDD (major depressive disorder) 06/18/2013  . MDD (major depressive disorder), recurrent episode, severe (HCC) 06/17/2013  . Homicidal ideation 06/17/2013    Past Surgical History:  Procedure Laterality Date  . DILATION AND CURETTAGE OF UTERUS    . JOINT REPLACEMENT    . TOTAL HIP ARTHROPLASTY Right 08/06/2016   Procedure: TOTAL HIP ARTHROPLASTY  ANTERIOR APPROACH;  Surgeon: Gean Birchwood, MD;  Location: [facility];  Service: Orthopedics;  Laterality: Right;  . WISDOM TOOTH EXTRACTION      OB History   No obstetric history on file.      Home Medications    Prior to Admission medications   Medication Sig Start Date End Date Taking? Authorizing Provider  ARIPiprazole (ABILIFY) 15 MG tablet Take 15 mg by mouth daily. 04/12/19   [provider]  budesonide-formoterol (SYMBICORT) 160-4.5 MCG/ACT inhaler Inhale 2 puffs into the lungs 2 (two) times daily. 07/22/20   Particia Nearing, PA-C  busPIRone (BUSPAR) 15 MG tablet Take 15 mg by mouth daily. 04/23/19   [provider]  cetirizine (ZYRTEC ALLERGY) 10 MG tablet Take 1 tablet (10 mg total) by mouth daily. 07/22/20   Particia Nearing, PA-C  doxycycline (VIBRAMYCIN) 100 MG capsule Take 1 capsule (100 mg total) by mouth 2 (two) times daily. 07/24/20   Raspet, Noberto Retort, PA-C  fluticasone (FLONASE) 50 MCG/ACT nasal spray Place 1 spray into both nostrils 2 (two) times daily as needed for allergies or rhinitis. 07/22/20   Particia Nearing, PA-C  HYDROcodone-acetaminophen (NORCO/VICODIN) 5-325 MG tablet Take 2 tablets by mouth 2 (two) times daily as needed for up to 3 days for severe pain. 08/16/20 08/19/20  Valinda Hoar, NP  ibuprofen (ADVIL) 800 MG  tablet Take 1 tablet (800 mg total) by mouth 3 (three) times daily. 08/16/20   Segel, Elita Boone, NP  lisdexamfetamine (VYVANSE) 60 MG capsule Take by mouth.    [provider]  pseudoephedrine (SUDAFED) 60 MG tablet Take 60 mg by mouth every 4 (four) hours as needed for congestion.    [provider]  tiZANidine (ZANAFLEX) 4 MG tablet Take 1 tablet (4 mg total) by mouth every 8 (eight) hours as needed for muscle spasms. 07/28/20   Raspet, Noberto Retort, PA-C  diphenhydrAMINE (SOMINEX) 25 MG tablet Take by mouth.  02/28/20  [provider]  lamoTRIgine (LAMICTAL) 200 MG tablet Take by mouth.  02/28/20  [provider]  metFORMIN (GLUCOPHAGE) 500 MG tablet Take 500 mg by mouth 2 (two) times daily. 11/22/16 12/11/18  [provider]  misoprostol (CYTOTEC) 100 MCG tablet Place vaginally. 04/21/13 02/28/20  [provider]    Family History Family History  Problem Relation Age of Onset  . Hypertension Mother   . Bipolar disorder Father   . Aneurysm Father   . Obesity Sister   . Breast cancer Sister   . Cervical cancer Sister   . Colon cancer Sister   . Bipolar disorder Sister   . Diabetes Other     Social History Social History   Tobacco Use  . Smoking status: Current Every Day Smoker    Packs/day: 0.50    Years: 18.00    Pack years: 9.00    Types: Cigarettes  . Smokeless tobacco: Never Used  Vaping Use  . Vaping Use: Never used  Substance Use Topics  . Alcohol use: Yes    Comment: occasionally  . Drug use: Never     Allergies   Latex, Olanzapine, Amoxicillin-pot clavulanate, Gabapentin, Tramadol, and Other   Review of Systems Review of Systems  Defer to HPI    Physical Exam Triage Vital Signs ED Triage Vitals  Enc Vitals Group     BP 08/16/20 1228 118/84     Pulse Rate 08/16/20 1228 85     Resp 08/16/20 1228 20     Temp 08/16/20 1228 98.1 F (36.7 C)     Temp Source 08/16/20 1228 Oral     SpO2 08/16/20 1228 98 %     Weight --      Height --      Head Circumference --      Peak Flow --      Pain Score 08/16/20 1226 7     Pain Loc --      Pain Edu? --      Excl. in GC? --    No data found.  Updated Vital Signs BP 118/84 (BP Location: Right Arm)   Pulse 85   Temp 98.1 F (36.7 C) (Oral)   Resp 20   LMP 08/15/2020   SpO2 98%   Visual Acuity Right Eye Distance:   Left Eye Distance:   Bilateral Distance:    Right Eye Near:   Left Eye Near:    Bilateral Near:     Physical Exam Constitutional:      Appearance: Normal appearance. She is normal weight.  HENT:     Head: Normocephalic.  Eyes:     Extraocular Movements:  Extraocular movements intact.  Pulmonary:     Effort: Pulmonary effort is normal.  Musculoskeletal:     Cervical back: Normal range of motion.     Right hip: Tenderness present. No deformity or bony tenderness. Normal  range of motion.     Left knee: Crepitus present. No swelling, effusion or erythema. Normal range of motion. No tenderness.       Legs:  Skin:    General: Skin is warm and dry.  Neurological:     General: No focal deficit present.     Mental Status: She is alert and oriented to person, place, and time. Mental status is at baseline.  Psychiatric:        Mood and Affect: Mood normal.        Behavior: Behavior normal.        Thought Content: Thought content normal.        Judgment: Judgment normal.      UC Treatments / Results  Labs (all labs ordered are listed, but only abnormal results are displayed) Labs Reviewed - No data to display  EKG   Radiology No results found.  Procedures Procedures (including critical care time)  Medications Ordered in UC Medications - No data to display  Initial Impression / Assessment and Plan / UC Course  I have reviewed the triage vital signs and the nursing notes.  Pertinent labs & imaging results that were available during my care of the patient were reviewed by me and considered in my medical decision making (see chart for details).  Chronic right hip pain Acute pain of left knee  1. Ibuprofen 800 mg tid  2. Oxycodone- acetaminophen 5-325 2 tablets twice a day prn. Given 3 day supply, reviewed PDMP  3. Discussed patient following up with PCP or orthopedic who did surgery for long term management of pain, verbalized understanding  4. Declined x-ray of knee, will follow up with orthopedics  Final Clinical Impressions(s) / UC Diagnoses   Final diagnoses:  Chronic right hip pain  Acute pain of left knee     Discharge Instructions     Can use ibuprofen three times a day for pain  Can take 2 vicodin pills twice a  day as needed for pain relief  Follow up with orthopedics for evaluation of hip and knee    ED Prescriptions    Medication Sig Dispense Auth. Provider   HYDROcodone-acetaminophen (NORCO/VICODIN) 5-325 MG tablet Take 2 tablets by mouth 2 (two) times daily as needed for up to 3 days for severe pain. 12 tablet Brindle, Venecia Mehl R, NP   ibuprofen (ADVIL) 800 MG tablet Take 1 tablet (800 mg total) by mouth 3 (three) times daily. 21 tablet Spurgeon, Elita Boone, NP     I have reviewed the PDMP during this encounter.   Letti, Towell, NP 08/16/20 1335

## 2020-08-16 NOTE — ED Triage Notes (Signed)
Pt c/o right hip pain and states it has gotten worse throughout the days.

## 2020-08-16 NOTE — ED Triage Notes (Signed)
Pt c/o left leg pain. She states she hear a popping sound when she bends it.

## 2020-09-07 ENCOUNTER — Other Ambulatory Visit: Payer: Self-pay

## 2020-09-07 ENCOUNTER — Encounter (HOSPITAL_COMMUNITY): Payer: Self-pay | Admitting: *Deleted

## 2020-09-07 ENCOUNTER — Ambulatory Visit (INDEPENDENT_AMBULATORY_CARE_PROVIDER_SITE_OTHER): Payer: Medicaid Other

## 2020-09-07 ENCOUNTER — Ambulatory Visit (HOSPITAL_COMMUNITY)
Admission: EM | Admit: 2020-09-07 | Discharge: 2020-09-07 | Disposition: A | Discharge status: Home / Self Care | Payer: Medicaid Other | Attending: Internal Medicine | Admitting: Internal Medicine

## 2020-09-07 DIAGNOSIS — M25562 Pain in left knee: Secondary | ICD-10-CM

## 2020-09-07 DIAGNOSIS — G8929 Other chronic pain: Secondary | ICD-10-CM

## 2020-09-07 MED ORDER — MELOXICAM 7.5 MG PO TABS: 7.5000 mg | ORAL_TABLET | Freq: Every day | ORAL | 0 refills | Status: AC

## 2020-09-07 NOTE — ED Triage Notes (Signed)
Pt reports a fall a couple of weeks ago. Pt now has pain to the back of Lt knee when standing and feels the knee crackle when bending knee.

## 2020-09-07 NOTE — Discharge Instructions (Signed)
Do not take the Mobic medication with your ibuprofen but you can take it with tylenol if needed

## 2020-09-07 NOTE — ED Provider Notes (Signed)
[facility]    CSN: [id number] Arrival date & time: 09/07/20  1059      History   Chief Complaint Chief Complaint  Patient presents with  . Knee Pain    HPI Theresa Mckay is a 40 y.o. female.   HPI  Left knee pain: Patient reports that a few weeks ago she tripped over her daughter's toys and fell onto her left knee.  Immediately had some discomfort.  Since then she has had some fullness discomfort and then when she bends the knee she notices that the knee feels full in nature and almost feels like there is a popping sensation.  She reports that a few years ago she had a similar injury and had a subsequent skin infection but other than that has not had any major injury to the knee.  She has tried a muscle relaxer for symptoms without much improvement.  No chest pain, shortness of breath, calf pain.   Past Medical History:  Diagnosis Date  . Anxiety   . Arthritis    "hip, left knee, hands" (08/06/2016)  . Avascular necrosis (HCC)    "hips" (08/06/2016)  . Bipolar 1 disorder (HCC)   . Childhood asthma   . Chronic back pain    "all over my back" (08/06/2016)  . Chronic bronchitis (HCC)   . DDD (degenerative disc disease), cervical   . Depression   . Elevated liver enzymes   . Hip pain   . Pneumonia 2015  . Pneumonia   . PTSD (post-traumatic stress disorder) dx'd 2012  . Seizures (HCC)    "non epilleptic; not often; usually when they're changing my seizure RX around" (08/06/2016)    Patient Active Problem List   Diagnosis Date Noted  . Arthritis of right hip 08/06/2016  . Avascular necrosis of hip, right (HCC) 08/01/2016  . Chronic posttraumatic stress disorder 06/19/2013  . MDD (major depressive disorder) 06/18/2013  . MDD (major depressive disorder), recurrent episode, severe (HCC) 06/17/2013  . Homicidal ideation 06/17/2013    Past Surgical History:  Procedure Laterality Date  . DILATION AND CURETTAGE OF UTERUS    . JOINT REPLACEMENT    . TOTAL HIP  ARTHROPLASTY Right 08/06/2016   Procedure: TOTAL HIP ARTHROPLASTY ANTERIOR APPROACH;  Surgeon: Gean Birchwood, MD;  Location: [facility];  Service: Orthopedics;  Laterality: Right;  . WISDOM TOOTH EXTRACTION      OB History   No obstetric history on file.      Home Medications    Prior to Admission medications   Medication Sig Start Date End Date Taking? Authorizing Provider  ARIPiprazole (ABILIFY) 15 MG tablet Take 15 mg by mouth daily. 04/12/19   [provider]  budesonide-formoterol (SYMBICORT) 160-4.5 MCG/ACT inhaler Inhale 2 puffs into the lungs 2 (two) times daily. 07/22/20   Particia Nearing, PA-C  busPIRone (BUSPAR) 15 MG tablet Take 15 mg by mouth daily. 04/23/19   [provider]  cetirizine (ZYRTEC ALLERGY) 10 MG tablet Take 1 tablet (10 mg total) by mouth daily. 07/22/20   Particia Nearing, PA-C  doxycycline (VIBRAMYCIN) 100 MG capsule Take 1 capsule (100 mg total) by mouth 2 (two) times daily. 07/24/20   Raspet, Noberto Retort, PA-C  fluticasone (FLONASE) 50 MCG/ACT nasal spray Place 1 spray into both nostrils 2 (two) times daily as needed for allergies or rhinitis. 07/22/20   Particia Nearing, PA-C  ibuprofen (ADVIL) 800 MG tablet Take 1 tablet (800 mg total) by mouth 3 (three) times daily.  08/16/20   Raia, Elita Boone, NP  lisdexamfetamine (VYVANSE) 60 MG capsule Take by mouth.    [provider]  pseudoephedrine (SUDAFED) 60 MG tablet Take 60 mg by mouth every 4 (four) hours as needed for congestion.    [provider]  tiZANidine (ZANAFLEX) 4 MG tablet Take 1 tablet (4 mg total) by mouth every 8 (eight) hours as needed for muscle spasms. 07/28/20   Raspet, Noberto Retort, PA-C  diphenhydrAMINE (SOMINEX) 25 MG tablet Take by mouth.  02/28/20  [provider]  lamoTRIgine (LAMICTAL) 200 MG tablet Take by mouth.  02/28/20  [provider]  metFORMIN (GLUCOPHAGE) 500 MG tablet Take 500 mg by mouth 2 (two) times daily. 11/22/16 12/11/18   [provider]  misoprostol (CYTOTEC) 100 MCG tablet Place vaginally. 04/21/13 02/28/20  [provider]    Family History Family History  Problem Relation Age of Onset  . Hypertension Mother   . Bipolar disorder Father   . Aneurysm Father   . Obesity Sister   . Breast cancer Sister   . Cervical cancer Sister   . Colon cancer Sister   . Bipolar disorder Sister   . Diabetes Other     Social History Social History   Tobacco Use  . Smoking status: Current Every Day Smoker    Packs/day: 0.50    Years: 18.00    Pack years: 9.00    Types: Cigarettes  . Smokeless tobacco: Never Used  Vaping Use  . Vaping Use: Never used  Substance Use Topics  . Alcohol use: Yes    Comment: occasionally  . Drug use: Never     Allergies   Latex, Olanzapine, Amoxicillin-pot clavulanate, Gabapentin, Tramadol, and Other   Review of Systems Review of Systems  As stated above in HPI Physical Exam Triage Vital Signs ED Triage Vitals  Enc Vitals Group     BP 09/07/20 1142 121/85     Pulse Rate 09/07/20 1142 88     Resp 09/07/20 1142 18     Temp 09/07/20 1142 98.4 F (36.9 C)     Temp src --      SpO2 09/07/20 1142 100 %     Weight --      Height --      Head Circumference --      Peak Flow --      Pain Score 09/07/20 1138 8     Pain Loc --      Pain Edu? --      Excl. in GC? --    No data found.  Updated Vital Signs BP 121/85   Pulse 88   Temp 98.4 F (36.9 C)   Resp 18   LMP 08/15/2020   SpO2 100%   Physical Exam Musculoskeletal:     Right knee: No swelling, bony tenderness or crepitus. Normal range of motion. No tenderness. No patellar tendon tenderness. No LCL laxity, MCL laxity, ACL laxity or PCL laxity. Normal alignment, normal meniscus and normal patellar mobility. Normal pulse.     Instability Tests: Anterior drawer test negative. Posterior drawer test negative. Anterior Lachman test negative. Medial McMurray test negative and lateral McMurray  test negative.     Left knee: Swelling (mild ) and crepitus present. No deformity, effusion, erythema, ecchymosis, lacerations or bony tenderness. Normal range of motion. Tenderness (anteior inferior) present over the patellar tendon. No LCL laxity, MCL laxity, ACL laxity or PCL laxity.Normal alignment, normal meniscus and normal patellar mobility. Normal pulse.  Instability Tests: Anterior drawer test negative. Posterior drawer test negative. Anterior Lachman test negative. Medial McMurray test negative and lateral McMurray test negative.      UC Treatments / Results  Labs (all labs ordered are listed, but only abnormal results are displayed) Labs Reviewed - No data to display  EKG   Radiology No results found.  Procedures Procedures (including critical care time)  Medications Ordered in UC Medications - No data to display  Initial Impression / Assessment and Plan / UC Course  I have reviewed the triage vital signs and the nursing notes.  Pertinent labs & imaging results that were available during my care of the patient were reviewed by me and considered in my medical decision making (see chart for details).     New.  Likely a patellar tendon strain given the tenderness to her inferior patellar tendon area.  He does have some edema of the knee which could be potentially related to a meniscal injury which we did discuss.  For now are going to trial a patellar knee brace along with Mobic x1 to 2 weeks.  If symptoms continue or fail to improve I have recommended that she see her PCP or orthopedics.  We also discussed red flag signs and symptoms in terms of a DVT which I do not feel is the case as she does have a negative Denna Haggard' sign and most of her discomfort is in the anterior knee.  She also is not on any OCP, no recent procedures or travels and no history of blood clots.  Final Clinical Impressions(s) / UC Diagnoses   Final diagnoses:  None   Discharge Instructions    None    ED Prescriptions    None     PDMP not reviewed this encounter.   Rushie Chestnut, [place] 09/07/20 2036

## 2020-12-10 ENCOUNTER — Ambulatory Visit (HOSPITAL_COMMUNITY): Payer: Self-pay

## 2020-12-30 ENCOUNTER — Other Ambulatory Visit: Payer: Self-pay

## 2020-12-30 ENCOUNTER — Encounter (HOSPITAL_COMMUNITY): Payer: Self-pay

## 2020-12-30 ENCOUNTER — Ambulatory Visit (HOSPITAL_COMMUNITY)
Admission: EM | Admit: 2020-12-30 | Discharge: 2020-12-30 | Disposition: A | Discharge status: Home / Self Care | Payer: Medicaid Other | Attending: Emergency Medicine | Admitting: Emergency Medicine

## 2020-12-30 DIAGNOSIS — M25551 Pain in right hip: Secondary | ICD-10-CM | POA: Diagnosis not present

## 2020-12-30 DIAGNOSIS — G8929 Other chronic pain: Secondary | ICD-10-CM

## 2020-12-30 MED ORDER — TIZANIDINE HCL 4 MG PO TABS: 4.0000 mg | ORAL_TABLET | Freq: Three times a day (TID) | ORAL | 0 refills | Status: DC | PRN

## 2020-12-30 MED ORDER — OXYCODONE-ACETAMINOPHEN 5-325 MG PO TABS: 1.0000 | ORAL_TABLET | Freq: Four times a day (QID) | ORAL | 0 refills | Status: DC | PRN

## 2020-12-30 NOTE — Discharge Instructions (Signed)
You can take the Percocet as needed for severe pain.  You can take Tylenol and/or Ibuprofen as needed for moderate pain or fever reduction.   You can take the Tizanidine as needed for muscle pain and spasms.  Do not take this before driving as it can make you sleepy.   Rest and drink plenty of fluids.   Follow up with your orthopedic doctor for re-evaluation and any additional medication refills.

## 2020-12-30 NOTE — ED Triage Notes (Signed)
Pr presents with bilateral hip pain. States she had hip surgery and states the pain has worsened x 2 days.

## 2020-12-30 NOTE — ED Provider Notes (Signed)
[facility]    CSN: [id number] Arrival date & time: 12/30/20  1751      History   Chief Complaint Chief Complaint  Patient presents with   Hip Pain    HPI Theresa Mckay is a 40 y.o. female.   Patient here for evaluation of bilateral hip pain.  Patient reports that she has had bilateral hip replacement surgery several years ago and states that she has had intermittent pain since.  Reports typically needing a pain medication, muscle relaxer, and a few days off of work to help.  Patient did report that she recently started a new job and has been standing and walking more frequent currently.  Reports taking OTC medication with minimal symptom relief.  Denies any trauma, injury, or other precipitating event.  Denies any fevers, chest pain, shortness of breath, N/V/D, numbness, tingling, weakness, abdominal pain, or headaches.    The history is provided by the patient.  Hip Pain   Past Medical History:  Diagnosis Date   Anxiety    Arthritis    "hip, left knee, hands" (08/06/2016)   Avascular necrosis (HCC)    "hips" (08/06/2016)   Bipolar 1 disorder (HCC)    Childhood asthma    Chronic back pain    "all over my back" (08/06/2016)   Chronic bronchitis (HCC)    DDD (degenerative disc disease), cervical    Depression    Elevated liver enzymes    Hip pain    Pneumonia 2015   Pneumonia    PTSD (post-traumatic stress disorder) dx'd 2012   Seizures (HCC)    "non epilleptic; not often; usually when they're changing my seizure RX around" (08/06/2016)    Patient Active Problem List   Diagnosis Date Noted   Arthritis of right hip 08/06/2016   Avascular necrosis of hip, right (HCC) 08/01/2016   Chronic posttraumatic stress disorder 06/19/2013   MDD (major depressive disorder) 06/18/2013   MDD (major depressive disorder), recurrent episode, severe (HCC) 06/17/2013   Homicidal ideation 06/17/2013    Past Surgical History:  Procedure Laterality Date   DILATION AND  CURETTAGE OF UTERUS     JOINT REPLACEMENT     TOTAL HIP ARTHROPLASTY Right 08/06/2016   Procedure: TOTAL HIP ARTHROPLASTY ANTERIOR APPROACH;  Surgeon: Gean Birchwood, MD;  Location: [facility];  Service: Orthopedics;  Laterality: Right;   WISDOM TOOTH EXTRACTION      OB History   No obstetric history on file.      Home Medications    Prior to Admission medications   Medication Sig Start Date End Date Taking? Authorizing Provider  oxyCODONE-acetaminophen (PERCOCET/ROXICET) 5-325 MG tablet Take 1 tablet by mouth every 6 (six) hours as needed for severe pain. 12/30/20  Yes Ivette Loyal, NP  ARIPiprazole (ABILIFY) 15 MG tablet Take 15 mg by mouth daily. 04/12/19   [provider]  budesonide-formoterol (SYMBICORT) 160-4.5 MCG/ACT inhaler Inhale 2 puffs into the lungs 2 (two) times daily. 07/22/20   Particia Nearing, PA-C  busPIRone (BUSPAR) 15 MG tablet Take 15 mg by mouth daily. 04/23/19   [provider]  cetirizine (ZYRTEC ALLERGY) 10 MG tablet Take 1 tablet (10 mg total) by mouth daily. 07/22/20   Particia Nearing, PA-C  doxycycline (VIBRAMYCIN) 100 MG capsule Take 1 capsule (100 mg total) by mouth 2 (two) times daily. 07/24/20   Raspet, Erin K, PA-C  fluticasone (FLONASE) 50 MCG/ACT nasal spray Place 1 spray into both nostrils 2 (two) times daily as needed for  allergies or rhinitis. 07/22/20   Particia Nearing, PA-C  lisdexamfetamine (VYVANSE) 60 MG capsule Take by mouth.    [provider]  pseudoephedrine (SUDAFED) 60 MG tablet Take 60 mg by mouth every 4 (four) hours as needed for congestion.    [provider]  tiZANidine (ZANAFLEX) 4 MG tablet Take 1 tablet (4 mg total) by mouth every 8 (eight) hours as needed for muscle spasms. 12/30/20   Ivette Loyal, NP  diphenhydrAMINE [facility]) 25 MG tablet Take by mouth.  02/28/20  [provider]  lamoTRIgine (LAMICTAL) 200 MG tablet Take by mouth.  02/28/20  [provider]   metFORMIN (GLUCOPHAGE) 500 MG tablet Take 500 mg by mouth 2 (two) times daily. 11/22/16 12/11/18  [provider]  misoprostol (CYTOTEC) 100 MCG tablet Place vaginally. 04/21/13 02/28/20  [provider]    Family History Family History  Problem Relation Age of Onset   Hypertension Mother    Bipolar disorder Father    Aneurysm Father    Obesity Sister    Breast cancer Sister    Cervical cancer Sister    Colon cancer Sister    Bipolar disorder Sister    Diabetes Other     Social History Social History   Tobacco Use   Smoking status: Every Day    Packs/day: 0.50    Years: 18.00    Pack years: 9.00    Types: Cigarettes   Smokeless tobacco: Never  Vaping Use   Vaping Use: Never used  Substance Use Topics   Alcohol use: Yes    Comment: occasionally   Drug use: Never     Allergies   Latex, Olanzapine, Amoxicillin-pot clavulanate, Gabapentin, Tramadol, and Other   Review of Systems Review of Systems  Musculoskeletal:  Positive for arthralgias.  All other systems reviewed and are negative.   Physical Exam Triage Vital Signs ED Triage Vitals  Enc Vitals Group     BP 12/30/20 1850 104/63     Pulse Rate 12/30/20 1850 96     Resp --      Temp 12/30/20 1850 98.4 F (36.9 C)     Temp Source 12/30/20 1850 Oral     SpO2 12/30/20 1850 100 %     Weight --      Height --      Head Circumference --      Peak Flow --      Pain Score 12/30/20 1848 7     Pain Loc --      Pain Edu? --      Excl. in GC? --    No data found.  Updated Vital Signs BP 104/63 (BP Location: Right Arm)   Pulse 96   Temp 98.4 F (36.9 C) (Oral)   LMP 12/08/2020 (Exact Date)   SpO2 100%   Visual Acuity Right Eye Distance:   Left Eye Distance:   Bilateral Distance:    Right Eye Near:   Left Eye Near:    Bilateral Near:     Physical Exam Vitals and nursing note reviewed.  Constitutional:      General: She is not in acute distress.    Appearance: Normal appearance.  She is not ill-appearing, toxic-appearing or diaphoretic.  HENT:     Head: Normocephalic and atraumatic.  Eyes:     Conjunctiva/sclera: Conjunctivae normal.  Cardiovascular:     Rate and Rhythm: Normal rate.     Pulses: Normal pulses.  Pulmonary:     Effort:  Pulmonary effort is normal.  Abdominal:     General: Abdomen is flat.  Musculoskeletal:        General: Normal range of motion.     Cervical back: Normal range of motion.     Right hip: Tenderness present. No deformity or bony tenderness. Normal range of motion. Normal strength.     Left hip: Tenderness present. No deformity or bony tenderness. Normal range of motion. Normal strength.  Skin:    General: Skin is warm and dry.  Neurological:     General: No focal deficit present.     Mental Status: She is alert and oriented to person, place, and time.  Psychiatric:        Mood and Affect: Mood normal.     UC Treatments / Results  Labs (all labs ordered are listed, but only abnormal results are displayed) Labs Reviewed - No data to display  EKG   Radiology No results found.  Procedures Procedures (including critical care time)  Medications Ordered in UC Medications - No data to display  Initial Impression / Assessment and Plan / UC Course  I have reviewed the triage vital signs and the nursing notes.  Pertinent labs & imaging results that were available during my care of the patient were reviewed by me and considered in my medical decision making (see chart for details).    Chronic hip pain.  Assessment negative for red flags or concerns.  We will treat with Percocet and tizanidine as needed for pain.  Patient was informed that she would not be able to get any additional pain medication refills from the urgent care.  Encourage fluids and rest.  Recommended that she follow-up with orthopedic surgeon for further evaluation and long-term management of her hip pain.   Final Clinical Impressions(s) / UC Diagnoses    Final diagnoses:  Chronic right hip pain     Discharge Instructions      You can take the Percocet as needed for severe pain.  You can take Tylenol and/or Ibuprofen as needed for moderate pain or fever reduction.   You can take the Tizanidine as needed for muscle pain and spasms.  Do not take this before driving as it can make you sleepy.   Rest and drink plenty of fluids.   Follow up with your orthopedic doctor for re-evaluation and any additional medication refills.      ED Prescriptions     Medication Sig Dispense Auth. Provider   tiZANidine (ZANAFLEX) 4 MG tablet Take 1 tablet (4 mg total) by mouth every 8 (eight) hours as needed for muscle spasms. 30 tablet Ivette Loyal, NP   oxyCODONE-acetaminophen (PERCOCET/ROXICET) 5-325 MG tablet Take 1 tablet by mouth every 6 (six) hours as needed for severe pain. 12 tablet Ivette Loyal, NP      I have reviewed the PDMP during this encounter.   Ivette Loyal, NP 12/30/20 2138

## 2021-01-05 ENCOUNTER — Other Ambulatory Visit: Payer: Self-pay

## 2021-01-05 ENCOUNTER — Encounter (HOSPITAL_COMMUNITY): Payer: Self-pay | Admitting: *Deleted

## 2021-01-05 ENCOUNTER — Ambulatory Visit (HOSPITAL_COMMUNITY)
Admission: EM | Admit: 2021-01-05 | Discharge: 2021-01-05 | Disposition: A | Discharge status: Home / Self Care | Payer: Medicaid Other

## 2021-01-05 DIAGNOSIS — N750 Cyst of Bartholin's gland: Secondary | ICD-10-CM | POA: Diagnosis not present

## 2021-01-05 NOTE — ED Triage Notes (Signed)
Pt reports feeling the abscess yesterday at opening of vagina. Pt reports there may be some bleeding from site.

## 2021-01-05 NOTE — ED Provider Notes (Signed)
[facility]    CSN: [id number] Arrival date & time: 01/05/21  5638      History   Chief Complaint Chief Complaint  Patient presents with   Abscess    HPI Theresa Mckay is a 40 y.o. female.  Yesterday patient noticed a small mass that she thinks is an abscess at the opening of her vagina.  Is not sure if it is bleeding as she has recurrent spotting and she is spotting at this time (could be her normal or there could be blood from the mass).  She has never had anything like this before.  Denies vaginal discharge, denies foul odor.  Denies purulent drainage from the lump/mass.  Denies abdominal pain.  Denies potential for retained foreign body in vagina.  Does not have a gynecologist.   Abscess Associated symptoms: no fever    Past Medical History:  Diagnosis Date   Anxiety    Arthritis    "hip, left knee, hands" (08/06/2016)   Avascular necrosis (HCC)    "hips" (08/06/2016)   Bipolar 1 disorder (HCC)    Childhood asthma    Chronic back pain    "all over my back" (08/06/2016)   Chronic bronchitis (HCC)    DDD (degenerative disc disease), cervical    Depression    Elevated liver enzymes    Hip pain    Pneumonia 2015   Pneumonia    PTSD (post-traumatic stress disorder) dx'd 2012   Seizures (HCC)    "non epilleptic; not often; usually when they're changing my seizure RX around" (08/06/2016)    Patient Active Problem List   Diagnosis Date Noted   Arthritis of right hip 08/06/2016   Avascular necrosis of hip, right (HCC) 08/01/2016   Chronic posttraumatic stress disorder 06/19/2013   MDD (major depressive disorder) 06/18/2013   MDD (major depressive disorder), recurrent episode, severe (HCC) 06/17/2013   Homicidal ideation 06/17/2013    Past Surgical History:  Procedure Laterality Date   DILATION AND CURETTAGE OF UTERUS     JOINT REPLACEMENT     TOTAL HIP ARTHROPLASTY Right 08/06/2016   Procedure: TOTAL HIP ARTHROPLASTY ANTERIOR APPROACH;  Surgeon: Gean Birchwood, MD;  Location: [facility];  Service: Orthopedics;  Laterality: Right;   WISDOM TOOTH EXTRACTION      OB History   No obstetric history on file.      Home Medications    Prior to Admission medications   Medication Sig Start Date End Date Taking? Authorizing Provider  ARIPiprazole (ABILIFY) 15 MG tablet Take 15 mg by mouth daily. 04/12/19  Yes [provider]  tiZANidine (ZANAFLEX) 4 MG tablet Take 1 tablet (4 mg total) by mouth every 8 (eight) hours as needed for muscle spasms. 12/30/20  Yes Ivette Loyal, NP  busPIRone (BUSPAR) 15 MG tablet Take 15 mg by mouth daily. 04/23/19   [provider]  cetirizine (ZYRTEC ALLERGY) 10 MG tablet Take 1 tablet (10 mg total) by mouth daily. 07/22/20   Particia Nearing, PA-C  lisdexamfetamine (VYVANSE) 60 MG capsule Take by mouth.    [provider]  diphenhydrAMINE (SOMINEX) 25 MG tablet Take by mouth.  02/28/20  [provider]  lamoTRIgine (LAMICTAL) 200 MG tablet Take by mouth.  02/28/20  [provider]  metFORMIN (GLUCOPHAGE) 500 MG tablet Take 500 mg by mouth 2 (two) times daily. 11/22/16 12/11/18  [provider]  misoprostol (CYTOTEC) 100 MCG tablet Place vaginally. 04/21/13 02/28/20  [provider]    Family  History Family History  Problem Relation Age of Onset   Hypertension Mother    Bipolar disorder Father    Aneurysm Father    Obesity Sister    Breast cancer Sister    Cervical cancer Sister    Colon cancer Sister    Bipolar disorder Sister    Diabetes Other     Social History Social History   Tobacco Use   Smoking status: Every Day    Packs/day: 0.50    Years: 18.00    Pack years: 9.00    Types: Cigarettes   Smokeless tobacco: Never  Vaping Use   Vaping Use: Never used  Substance Use Topics   Alcohol use: Yes    Comment: occasionally   Drug use: Never     Allergies   Latex, Olanzapine, Amoxicillin-pot clavulanate, Gabapentin, Tramadol, and  Other   Review of Systems Review of Systems  Constitutional:  Negative for chills and fever.  Gastrointestinal:  Negative for abdominal pain.  Genitourinary:  Negative for genital sores and vaginal discharge.       Mass or lump at vaginal opening.    Physical Exam Triage Vital Signs ED Triage Vitals  Enc Vitals Group     BP 01/05/21 1120 109/68     Pulse Rate 01/05/21 1120 84     Resp --      Temp 01/05/21 1120 97.9 F (36.6 C)     Temp src --      SpO2 01/05/21 1120 99 %     Weight --      Height --      Head Circumference --      Peak Flow --      Pain Score 01/05/21 1114 8     Pain Loc --      Pain Edu? --      Excl. in GC? --    No data found.  Updated Vital Signs BP 109/68   Pulse 84   Temp 97.9 F (36.6 C)   LMP 12/08/2020 (Exact Date)   SpO2 99%   Visual Acuity Right Eye Distance:   Left Eye Distance:   Bilateral Distance:    Right Eye Near:   Left Eye Near:    Bilateral Near:     Physical Exam Constitutional:      Appearance: Normal appearance.  Genitourinary:   Neurological:     Mental Status: She is alert.     UC Treatments / Results  Labs (all labs ordered are listed, but only abnormal results are displayed) Labs Reviewed - No data to display  EKG   Radiology No results found.  Procedures Procedures (including critical care time)  Medications Ordered in UC Medications - No data to display  Initial Impression / Assessment and Plan / UC Course  I have reviewed the triage vital signs and the nursing notes.  Pertinent labs & imaging results that were available during my care of the patient were reviewed by me and considered in my medical decision making (see chart for details).  I suspect this is a Bartholin gland cyst.  It does not appear to be abscessed at this time.  Reviewed conservative therapy for Bartholin gland cyst.  Reviewed reasons for following up with GYN.  Final Clinical Impressions(s) / UC Diagnoses   Final  diagnoses:  Bartholin gland cyst     Discharge Instructions      Use warm baths, sitz baths, or warm compresses at least twice a day (more often can  be helpful if you can fit it in) until the cyst goes away. If it does not go away or if you feel like it is getting bigger and more painful, get an appointment with a gynecologist for follow up.    ED Prescriptions   None    PDMP not reviewed this encounter.   Cathlyn Parsons, NP 01/05/21 1258

## 2021-01-05 NOTE — Discharge Instructions (Signed)
Use warm baths, sitz baths, or warm compresses at least twice a day (more often can be helpful if you can fit it in) until the cyst goes away. If it does not go away or if you feel like it is getting bigger and more painful, get an appointment with a gynecologist for follow up.

## 2021-01-05 NOTE — ED Notes (Signed)
Pt called to phone and left a voice mail.

## 2021-01-05 NOTE — ED Notes (Signed)
Pt was in the incorrect room that was it was discharged, the we undo the discharge.

## 2021-01-05 NOTE — ED Notes (Signed)
Called pt in lobby no answered.

## 2021-01-24 ENCOUNTER — Emergency Department (HOSPITAL_COMMUNITY)
Admission: EM | Admit: 2021-01-24 | Discharge: 2021-01-24 | Disposition: A | Discharge status: Home / Self Care | Payer: Medicaid Other | Attending: Student | Admitting: Student

## 2021-01-24 ENCOUNTER — Other Ambulatory Visit: Payer: Self-pay

## 2021-01-24 ENCOUNTER — Emergency Department (HOSPITAL_COMMUNITY): Payer: Medicaid Other

## 2021-01-24 ENCOUNTER — Ambulatory Visit (HOSPITAL_COMMUNITY): Payer: Self-pay

## 2021-01-24 DIAGNOSIS — Z96641 Presence of right artificial hip joint: Secondary | ICD-10-CM | POA: Insufficient documentation

## 2021-01-24 DIAGNOSIS — W108XXA Fall (on) (from) other stairs and steps, initial encounter: Secondary | ICD-10-CM | POA: Diagnosis not present

## 2021-01-24 DIAGNOSIS — M5021 Other cervical disc displacement,  high cervical region: Secondary | ICD-10-CM | POA: Insufficient documentation

## 2021-01-24 DIAGNOSIS — J45909 Unspecified asthma, uncomplicated: Secondary | ICD-10-CM | POA: Diagnosis not present

## 2021-01-24 DIAGNOSIS — Z91040 Latex allergy status: Secondary | ICD-10-CM | POA: Diagnosis not present

## 2021-01-24 DIAGNOSIS — S4991XA Unspecified injury of right shoulder and upper arm, initial encounter: Secondary | ICD-10-CM | POA: Insufficient documentation

## 2021-01-24 DIAGNOSIS — Z7984 Long term (current) use of oral hypoglycemic drugs: Secondary | ICD-10-CM | POA: Insufficient documentation

## 2021-01-24 DIAGNOSIS — F17210 Nicotine dependence, cigarettes, uncomplicated: Secondary | ICD-10-CM | POA: Diagnosis not present

## 2021-01-24 DIAGNOSIS — M25511 Pain in right shoulder: Secondary | ICD-10-CM

## 2021-01-24 LAB — POC URINE PREG, ED: Preg Test, Ur: NEGATIVE

## 2021-01-24 MED ORDER — METHOCARBAMOL 500 MG PO TABS: 500.0000 mg | ORAL_TABLET | Freq: Two times a day (BID) | ORAL | 0 refills | Status: DC | PRN

## 2021-01-24 MED ORDER — METHOCARBAMOL 500 MG PO TABS
500.0000 mg | ORAL_TABLET | Freq: Once | ORAL | Status: AC
  Administered 2021-01-24: 500 mg via ORAL
  Filled 2021-01-24: qty 1

## 2021-01-24 NOTE — ED Provider Notes (Addendum)
Emergency Medicine Provider Triage Evaluation Note  Theresa Mckay , a 40 y.o. female  was evaluated in triage.  Patient reports history of avascular necrosis of the right hip otherwise healthy presented today for right shoulder pain onset 10 days ago patient had a fall when it was raining down some stairs landed on the right shoulder.  Pain was initially severe has gradually improved pain radiates down the outside of the right shoulder to the right elbow.  Patient reports intermittent tingling of the right hand since time of injury.  Review of Systems  Positive: Shoulder pain, hand tingling Negative: Head injury, loss consciousness, blood thinner use, vision changes, headache, neck pain, chest pain, abdominal pain, weakness, history of stroke  Physical Exam  BP 129/80 (BP Location: Right Arm)   Pulse 83   Temp 98.5 F (36.9 C) (Oral)   Resp 17   SpO2 99%  Gen:   Awake, no distress   Resp:  Normal effort  MSK:   No midline spinal tenderness palpation.  No crepitus step-off or deformity.  Patient has tenderness to palpation of the right trapezius and right parascapular muscles, some tenderness to the right deltoid as well.  Pain increases with extension of the right shoulder, patient reports pain improves with overhead movements.  No pain with ROM of the elbow.  Capillary refill and sensation intact to all fingers.  Negative Hoffmann sign bilaterally.  Radial pulses intact and equal. Other:  Speech is clear and goal oriented, follows commands Major Cranial nerves without deficit, no facial droop Normal strength in upper and lower extremities bilaterally including dorsiflexion and plantar flexion, strong and equal grip strength Sensation slightly decreased to the index finger, fourth and fifth finger of the right hand compared to left otherwise sensation normal bilaterally. Moves extremities without ataxia, coordination intact Normal finger to nose and rapid alternating movements Neg romberg,  no pronator drift Normal gait  Medical Decision Making  Medically screening exam initiated at 7:46 AM.  Appropriate orders placed.  Theresa Mckay was informed that the remainder of the evaluation will be completed by another provider, this initial triage assessment does not replace that evaluation, and the importance of remaining in the ED until their evaluation is complete.  40 year old female presented for right shoulder pain with intermittent right hand tingling for the past 10 days after a fall onto the right shoulder.  Pain is reproducible with palpation of the shoulder and with extension of the shoulder.  She has no neck pain on examination, she has some tingling to the index fourth and fifth finger of the of the right hand.  Otherwise neurologic exam is within normal limits.   Discussed case with attending physician Dr. Audrie Lia, considering patient's neurologic complaint does not follow a specific distribution she did have a fall 10 days ago feel that a CT scan of the cervical spine would be more beneficial than a plain x-ray to evaluate for cervical spine injury.  I discussed risk versus benefits of CT cervical spine with the patient and patient stated understanding and agreed with a CT scan.  X-rays of the right upper extremity were also ordered.  Note: Portions of this report may have been transcribed using voice recognition software. Every effort was made to ensure accuracy; however, inadvertent computerized transcription errors may still be present.    Bill Salinas, PA-C 01/24/21 0750    Bill Salinas, PA-C 01/24/21 0800    Glendora Score, MD 01/24/21 1558

## 2021-01-24 NOTE — ED Notes (Signed)
Pt teaching provided on medications that may cause drowsiness. Pt instructed not to drive or operate heavy machinery while taking the prescribed medication. Pt verbalized understanding.  ? ?Pt provided discharge instructions and prescription information. Pt was given the opportunity to ask questions and questions were answered. Discharge signature not obtained in the setting of the COVID-19 pandemic in order to reduce high touch surfaces.  ? ?

## 2021-01-24 NOTE — ED Provider Notes (Signed)
[facility] EMERGENCY DEPARTMENT Provider Note   CSN: [id number] Arrival date & time: 01/24/21  6767     History Chief Complaint  Patient presents with   Shoulder Pain    Theresa Mckay is a 40 y.o. female.  With past medical history of avascular necrosis of the hip, degenerative disc disease, arthritis who presents emergency department with right shoulder pain.  She states that about 10 days ago she was walking down metal stairs in the rain when she slipped and fell.  States that she landed on her bottom.  Denies hitting her head or loss of consciousness.  She did not notice any immediate injuries however since falling she has had gradually increasing pain in her right scapula that radiates down the right arm into the hand.  He complains of numbness to the back of the right upper arm, and numbness and tingling to the second and third digits.  She states that the numbness has progressed to at times having numbness of the palmar surface of the hand.  She denies taking any medication since then.  She does not complain of headache, neck pain.  Denies fever, visual changes, chest pain or abdominal pain.  She is not anticoagulated.   Shoulder Pain Associated symptoms: no back pain, no fever and no neck pain       Past Medical History:  Diagnosis Date   Anxiety    Arthritis    "hip, left knee, hands" (08/06/2016)   Avascular necrosis (HCC)    "hips" (08/06/2016)   Bipolar 1 disorder (HCC)    Childhood asthma    Chronic back pain    "all over my back" (08/06/2016)   Chronic bronchitis (HCC)    DDD (degenerative disc disease), cervical    Depression    Elevated liver enzymes    Hip pain    Pneumonia 2015   Pneumonia    PTSD (post-traumatic stress disorder) dx'd 2012   Seizures (HCC)    "non epilleptic; not often; usually when they're changing my seizure RX around" (08/06/2016)    Patient Active Problem List   Diagnosis Date Noted   Arthritis of right hip  08/06/2016   Avascular necrosis of hip, right (HCC) 08/01/2016   Chronic posttraumatic stress disorder 06/19/2013   MDD (major depressive disorder) 06/18/2013   MDD (major depressive disorder), recurrent episode, severe (HCC) 06/17/2013   Homicidal ideation 06/17/2013    Past Surgical History:  Procedure Laterality Date   DILATION AND CURETTAGE OF UTERUS     JOINT REPLACEMENT     TOTAL HIP ARTHROPLASTY Right 08/06/2016   Procedure: TOTAL HIP ARTHROPLASTY ANTERIOR APPROACH;  Surgeon: Gean Birchwood, MD;  Location: [facility] OR;  Service: Orthopedics;  Laterality: Right;   WISDOM TOOTH EXTRACTION       OB History   No obstetric history on file.     Family History  Problem Relation Age of Onset   Hypertension Mother    Bipolar disorder Father    Aneurysm Father    Obesity Sister    Breast cancer Sister    Cervical cancer Sister    Colon cancer Sister    Bipolar disorder Sister    Diabetes Other     Social History   Tobacco Use   Smoking status: Every Day    Packs/day: 0.50    Years: 18.00    Pack years: 9.00    Types: Cigarettes   Smokeless tobacco: Never  Vaping Use   Vaping Use: Never used  Substance Use Topics   Alcohol use: Yes    Comment: occasionally   Drug use: Never    Home Medications Prior to Admission medications   Medication Sig Start Date End Date Taking? Authorizing Provider  ARIPiprazole (ABILIFY) 15 MG tablet Take 15 mg by mouth daily. 04/12/19   [provider]  busPIRone (BUSPAR) 15 MG tablet Take 15 mg by mouth daily. 04/23/19   [provider]  cetirizine (ZYRTEC ALLERGY) 10 MG tablet Take 1 tablet (10 mg total) by mouth daily. 07/22/20   Particia Nearing, PA-C  lisdexamfetamine (VYVANSE) 60 MG capsule Take by mouth.    [provider]  tiZANidine (ZANAFLEX) 4 MG tablet Take 1 tablet (4 mg total) by mouth every 8 (eight) hours as needed for muscle spasms. 12/30/20   Ivette Loyal, NP  diphenhydrAMINE [facility]) 25 MG  tablet Take by mouth.  02/28/20  [provider]  lamoTRIgine (LAMICTAL) 200 MG tablet Take by mouth.  02/28/20  [provider]  metFORMIN (GLUCOPHAGE) 500 MG tablet Take 500 mg by mouth 2 (two) times daily. 11/22/16 12/11/18  [provider]  misoprostol (CYTOTEC) 100 MCG tablet Place vaginally. 04/21/13 02/28/20  [provider]    Allergies    Latex, Olanzapine, Amoxicillin-pot clavulanate, Gabapentin, Tramadol, and Other  Review of Systems   Review of Systems  Constitutional:  Negative for fever.  Musculoskeletal:  Positive for arthralgias and myalgias. Negative for back pain, joint swelling, neck pain and neck stiffness.  Neurological:  Negative for syncope, weakness and headaches.  All other systems reviewed and are negative.  Physical Exam Updated Vital Signs BP 123/77 (BP Location: Right Arm)   Pulse 73   Temp 98.5 F (36.9 C) (Oral)   Resp 15   LMP 01/12/2021   SpO2 100%   Physical Exam Vitals and nursing note reviewed.  Constitutional:      General: She is not in acute distress.    Appearance: Normal appearance. She is not toxic-appearing.  HENT:     Head: Normocephalic and atraumatic.  Eyes:     General: No scleral icterus.    Extraocular Movements: Extraocular movements intact.     Pupils: Pupils are equal, round, and reactive to light.  Cardiovascular:     Pulses: Normal pulses.  Pulmonary:     Effort: Pulmonary effort is normal. No respiratory distress.  Abdominal:     Palpations: Abdomen is soft.  Musculoskeletal:        General: Tenderness present. No swelling, deformity or signs of injury. Normal range of motion.       Arms:     Cervical back: Normal range of motion and neck supple. No rigidity or tenderness.     Comments: Tenderness to palpation of the right trap over the scapula.  Skin:    General: Skin is warm and dry.     Capillary Refill: Capillary refill takes less than 2 seconds.     Findings: No bruising or  rash.  Neurological:     General: No focal deficit present.     Mental Status: She is alert and oriented to person, place, and time. Mental status is at baseline.     GCS: GCS eye subscore is 4. GCS verbal subscore is 5. GCS motor subscore is 6.     Cranial Nerves: Cranial nerves are intact.     Sensory: Sensory deficit present.     Motor: Motor function is intact. No weakness.     Comments: 5/5  strength of bilateral upper extremities. She is able to tell me that there is some numbness when testing sensation on the bilateral forearms.  Has no sensation changes to the face, shoulders.   Psychiatric:        Mood and Affect: Mood normal.        Behavior: Behavior normal.        Thought Content: Thought content normal.        Judgment: Judgment normal.    ED Results / Procedures / Treatments   Labs (all labs ordered are listed, but only abnormal results are displayed) Labs Reviewed  POC URINE PREG, ED   EKG None  Radiology DG Shoulder Right  Result Date: 01/24/2021 CLINICAL DATA:  Pain EXAM: RIGHT SHOULDER - 2+ VIEW COMPARISON:  None. FINDINGS: There is no evidence of fracture or dislocation. There is no evidence of arthropathy or other focal bone abnormality. Soft tissues are unremarkable. IMPRESSION: Negative. Electronically Signed   By: Signa Kell M.D.   On: 01/24/2021 08:44   DG Elbow Complete Right  Result Date: 01/24/2021 CLINICAL DATA:  Fall EXAM: RIGHT ELBOW - COMPLETE 3+ VIEW COMPARISON:  None. FINDINGS: There is no evidence of fracture, dislocation, or joint effusion. There is no evidence of arthropathy or other focal bone abnormality. Soft tissues are unremarkable. IMPRESSION: Negative. Electronically Signed   By: Signa Kell M.D.   On: 01/24/2021 08:45   CT Cervical Spine Wo Contrast  Result Date: 01/24/2021 CLINICAL DATA:  Larey Seat 9 days ago. Persistent right shoulder and radiating right arm pain and numbness. EXAM: CT CERVICAL SPINE WITHOUT CONTRAST TECHNIQUE:  Multidetector CT imaging of the cervical spine was performed without intravenous contrast. Multiplanar CT image reconstructions were also generated. COMPARISON:  None. FINDINGS: Alignment: Normal Skull base and vertebrae: No acute fracture. No primary bone lesion or focal pathologic process. Soft tissues and spinal canal: No prevertebral fluid or swelling. No visible canal hematoma. Disc levels:  C2-3: No significant findings. C3-4: Very small central disc protrusion with mild impression on the central aspect of the ventral thecal sac. No foraminal stenosis. C4-5: No significant findings. C5-6: Moderate degenerative disc disease with disc space narrowing, bulging annulus, osteophytic ridging and uncinate spurring. Mild flattening of the ventral thecal sac without significant spinal stenosis. There is moderate left and mild right foraminal stenosis. C6-7: No significant findings. C7-T1: No significant findings. Upper chest: The lung apices are grossly clear. Other: No neck mass or adenopathy. The thyroid gland is unremarkable. IMPRESSION: 1. Normal alignment and no acute bony findings. 2. Moderate degenerative disc disease at C5-6 with moderate left and mild right foraminal stenosis. 3. Very small central disc protrusion at C3-4. Electronically Signed   By: Rudie Meyer M.D.   On: 01/24/2021 09:07   DG Humerus Right  Result Date: 01/24/2021 CLINICAL DATA:  Fall several weeks ago. EXAM: RIGHT HUMERUS - 2+ VIEW COMPARISON:  None. FINDINGS: There is no evidence of fracture or other focal bone lesions. Soft tissues are unremarkable. IMPRESSION: Negative. Electronically Signed   By: Signa Kell M.D.   On: 01/24/2021 08:46    Procedures Procedures   Medications Ordered in ED Medications  methocarbamol (ROBAXIN) tablet 500 mg (500 mg Oral Given 01/24/21 1542)    ED Course  I have reviewed the triage vital signs and the nursing notes.  Pertinent labs & imaging results that were available during my care  of the patient were reviewed by me and considered in my medical decision making (see chart for  details).    MDM Rules/Calculators/A&P 40 year old female who presents emergency department with pain of the right shoulder.  Pain in the right shoulder and arm appear to be sequela of degenerative cervical changes and musculoskeletal spasm.  She has no back pain red flags on her history or physical exam.  CT cervical spine shows no acute bony fractures or listhesis.  She does have moderate DDD at C5-6 with moderate left and right foraminal stenosis.  She has a very small central disc protrusion at C3-C4. Plain films of her right shoulder, humerus, elbow with no acute fractures or dislocations.  Her pain is most consistent with cervical radiculopathy.  I have referred her to neurosurgery for continued and definitive management of her symptoms.  I have also given her prescription for Robaxin to help with pain relief.  She can also use Tylenol and ibuprofen as needed.  I am not prescribing her a course of steroids at this time as she has history of avascular necrosis of her hip and it is contraindicated.  I discussed the findings of her work-up at the bedside.  She demonstrates understanding with teach back.  She understands return to emergency department if she begins having weakness in the extremity or worsening symptoms, fever, sudden onset neck pain. Final Clinical Impression(s) / ED Diagnoses Final diagnoses:  Acute pain of right shoulder    Rx / DC Orders ED Discharge Orders          Ordered    methocarbamol (ROBAXIN) 500 MG tablet  2 times daily PRN,   Status:  Discontinued        01/24/21 1531    methocarbamol (ROBAXIN) 500 MG tablet  2 times daily PRN        01/24/21 1536             Cristopher Peru, PA-C 01/24/21 1731    Terald Sleeper, MD 01/24/21 1821

## 2021-01-24 NOTE — ED Triage Notes (Signed)
PT c/o of right arm shoulder pain and numbness in right hand. Per pt, they fell on 01/14/2021. No thinners.

## 2021-01-24 NOTE — Discharge Instructions (Addendum)
You were seen in the emergency department for pain in your shoulder after a fall.  The images of your neck showed that you do have some bulging of one of the disc spaces which may be causing her symptoms.  However you do not have obvious weakness that would require an intervention at this time.  I have provided you with follow-up to [facility] who would be able to provide definitive management for your symptoms.  There information is in your discharge paperwork for you to call them today to schedule appointment for next week.  The meantime I provided you with a medication called Robaxin.  This is a muscle relaxant use for muscle spasms that may help with some relief of symptoms.  Please return to the emergency department if you have worsening numbness or tingling in the arm, weakness like not being able to lift the arm, fevers.

## 2021-02-10 ENCOUNTER — Other Ambulatory Visit: Payer: Self-pay

## 2021-02-10 ENCOUNTER — Ambulatory Visit (HOSPITAL_COMMUNITY)
Admission: EM | Admit: 2021-02-10 | Discharge: 2021-02-10 | Disposition: A | Discharge status: Home / Self Care | Payer: Medicaid Other | Attending: Emergency Medicine | Admitting: Emergency Medicine

## 2021-02-10 ENCOUNTER — Encounter (HOSPITAL_COMMUNITY): Payer: Self-pay

## 2021-02-10 DIAGNOSIS — J111 Influenza due to unidentified influenza virus with other respiratory manifestations: Secondary | ICD-10-CM | POA: Insufficient documentation

## 2021-02-10 DIAGNOSIS — Z20822 Contact with and (suspected) exposure to covid-19: Secondary | ICD-10-CM | POA: Diagnosis not present

## 2021-02-10 MED ORDER — ONDANSETRON 8 MG PO TBDP: ORAL_TABLET | ORAL | 0 refills | Status: DC

## 2021-02-10 MED ORDER — IBUPROFEN 600 MG PO TABS: 600.0000 mg | ORAL_TABLET | Freq: Four times a day (QID) | ORAL | 0 refills | Status: DC | PRN

## 2021-02-10 MED ORDER — FLUTICASONE PROPIONATE 50 MCG/ACT NA SUSP: 2.0000 | Freq: Every day | NASAL | 0 refills | Status: DC

## 2021-02-10 MED ORDER — OSELTAMIVIR PHOSPHATE 75 MG PO CAPS: 75.0000 mg | ORAL_CAPSULE | Freq: Two times a day (BID) | ORAL | 0 refills | Status: DC

## 2021-02-10 NOTE — ED Provider Notes (Signed)
HPI  SUBJECTIVE:  Theresa Mckay is a 40 y.o. female who presents with fevers T-max 101.2, headaches, body aches, nasal congestion, rhinorrhea, mild cough, 1 episode of vomiting with nausea, diarrhea starting yesterday.  No sore throat, postnasal drip, loss of sense of smell or taste, shortness of breath, abdominal pain.  She was exposed to influenza 6 days ago.  No known COVID exposure.  She took ibuprofen within 6 hours of evaluation.  She did not yet get this years flu vaccine.  She has gotten the second dose of the COVID vaccine.  She has been taking ibuprofen 600 mg every 4-6 hours with improvement in her symptoms and Zyrtec.  No aggravating factors.  She has a past medical history of seizures and asthma as a child.  LMP: 5 days ago.  Denies possibility being pregnant.  [id number], Theresa Bath, FNP   Past Medical History:  Diagnosis Date   Anxiety    Arthritis    "hip, left knee, hands" (08/06/2016)   Avascular necrosis (HCC)    "hips" (08/06/2016)   Bipolar 1 disorder (HCC)    Childhood asthma    Chronic back pain    "all over my back" (08/06/2016)   Chronic bronchitis (HCC)    DDD (degenerative disc disease), cervical    Depression    Elevated liver enzymes    Hip pain    Pneumonia 2015   Pneumonia    PTSD (post-traumatic stress disorder) dx'd 2012   Seizures (HCC)    "non epilleptic; not often; usually when they're changing my seizure RX around" (08/06/2016)    Past Surgical History:  Procedure Laterality Date   DILATION AND CURETTAGE OF UTERUS     JOINT REPLACEMENT     TOTAL HIP ARTHROPLASTY Right 08/06/2016   Procedure: TOTAL HIP ARTHROPLASTY ANTERIOR APPROACH;  Surgeon: Gean Birchwood, MD;  Location: [facility] OR;  Service: Orthopedics;  Laterality: Right;   WISDOM TOOTH EXTRACTION      Family History  Problem Relation Age of Onset   Hypertension Mother    Bipolar disorder Father    Aneurysm Father    Obesity Sister    Breast cancer Sister    Cervical cancer Sister    Colon  cancer Sister    Bipolar disorder Sister    Diabetes Other     Social History   Tobacco Use   Smoking status: Every Day    Packs/day: 0.50    Years: 18.00    Pack years: 9.00    Types: Cigarettes   Smokeless tobacco: Never  Vaping Use   Vaping Use: Never used  Substance Use Topics   Alcohol use: Yes    Comment: occasionally   Drug use: Never    No current facility-administered medications for this encounter.  Current Outpatient Medications:    fluticasone (FLONASE) 50 MCG/ACT nasal spray, Place 2 sprays into both nostrils daily., Disp: 16 g, Rfl: 0   ibuprofen (ADVIL) 600 MG tablet, Take 1 tablet (600 mg total) by mouth every 6 (six) hours as needed., Disp: 30 tablet, Rfl: 0   ondansetron (ZOFRAN ODT) 8 MG disintegrating tablet, 1/2- 1 tablet q 8 hr prn nausea, vomiting, Disp: 20 tablet, Rfl: 0   oseltamivir (TAMIFLU) 75 MG capsule, Take 1 capsule (75 mg total) by mouth 2 (two) times daily. X 5 days, Disp: 10 capsule, Rfl: 0   ARIPiprazole (ABILIFY) 15 MG tablet, Take 15 mg by mouth daily., Disp: , Rfl:    busPIRone (BUSPAR) 15 MG tablet, Take 15 mg  by mouth daily., Disp: , Rfl:    lisdexamfetamine (VYVANSE) 60 MG capsule, Take by mouth., Disp: , Rfl:    methocarbamol (ROBAXIN) 500 MG tablet, Take 1 tablet (500 mg total) by mouth 2 (two) times daily as needed for muscle spasms., Disp: 20 tablet, Rfl: 0  Allergies  Allergen Reactions   Latex Hives    Hives, swelling   Olanzapine Other (See Comments)    Becomes psychotic pyschotic breakdown Psychosis    Amoxicillin-Pot Clavulanate Diarrhea, Nausea And Vomiting and Other (See Comments)    "Made me feel like I have the flu"  "Made me feel like I have the flu"   Gabapentin Other (See Comments)    Increased BP Elevates blood pressure   Tramadol Hives, Itching and Rash    rash   Other Rash     ROS  As noted in HPI.   Physical Exam  BP 137/81 (BP Location: Left Arm)   Pulse 73   Temp 98.6 F (37 C) (Oral)    Resp 18   LMP  (Within Days)   SpO2 100%   Constitutional: Well developed, well nourished, no acute distress Eyes:  EOMI, conjunctiva normal bilaterally HENT: Normocephalic, atraumatic,mucus membranes moist positive nasal congestion Respiratory: Normal inspiratory effort, lungs clear bilaterally Cardiovascular: Normal rate, regular rhythm no murmurs rubs or gallop GI: nondistended skin: No rash, skin intact Musculoskeletal: no deformities Neurologic: Alert & oriented x 3, no focal neuro deficits Psychiatric: Speech and behavior appropriate   ED Course   Medications - No data to display  Orders Placed This Encounter  Procedures   SARS CORONAVIRUS 2 (TAT 6-24 HRS) Nasopharyngeal Nasopharyngeal Swab    Standing Status:   Standing    Number of Occurrences:   1    Results for orders placed or performed during the hospital encounter of 02/10/21 (from the past 24 hour(s))  SARS CORONAVIRUS 2 (TAT 6-24 HRS) Nasopharyngeal Nasopharyngeal Swab     Status: None   Collection Time: 02/10/21  8:21 PM   Specimen: Nasopharyngeal Swab  Result Value Ref Range   SARS Coronavirus 2 NEGATIVE NEGATIVE   No results found.  ED Clinical Impression  1. Influenza   2. Encounter for laboratory testing for COVID-19 virus      ED Assessment/Plan  Patient with close exposure to influenza.  Did not do influenza testing as it would not change management.  We will treat for flu.  Also ibuprofen/Tylenol, Flonase, saline nasal irrigation, Zofran, push fluids.  Work note.  Follow-up with PMD as needed.   Checking  COVID- she will qualify for antivirals based on BMI and history of asthma. COVID-negative.  Plan as above.  Discussed labs, MDM, treatment plan, and plan for follow-up with patient. patient agrees with plan.   Meds ordered this encounter  Medications   oseltamivir (TAMIFLU) 75 MG capsule    Sig: Take 1 capsule (75 mg total) by mouth 2 (two) times daily. X 5 days    Dispense:  10 capsule     Refill:  0   ibuprofen (ADVIL) 600 MG tablet    Sig: Take 1 tablet (600 mg total) by mouth every 6 (six) hours as needed.    Dispense:  30 tablet    Refill:  0   fluticasone (FLONASE) 50 MCG/ACT nasal spray    Sig: Place 2 sprays into both nostrils daily.    Dispense:  16 g    Refill:  0   ondansetron (ZOFRAN ODT) 8 MG disintegrating tablet  Sig: 1/2- 1 tablet q 8 hr prn nausea, vomiting    Dispense:  20 tablet    Refill:  0      *This clinic note was created using Scientist, clinical (histocompatibility and immunogenetics). Therefore, there may be occasional mistakes despite careful proofreading.  ?    Domenick Gong, MD 02/11/21 [phone]

## 2021-02-10 NOTE — Discharge Instructions (Addendum)
600 mg of ibuprofen bind with 1000 mg of Tylenol together 3-4 times a day as needed for body aches, headaches, fevers, Flonase, saline nasal irrigation with a [employer]Med sinus rinse and distilled water as often as you, Zofran for nausea, push fluids.  Finish the Tamiflu, even if you feel better.

## 2021-02-10 NOTE — ED Triage Notes (Signed)
Pt reports fever 101.2 F, chills, fatigue and vomiting x 1 day.

## 2021-02-11 LAB — SARS CORONAVIRUS 2 (TAT 6-24 HRS): SARS Coronavirus 2: NEGATIVE

## 2021-02-13 ENCOUNTER — Ambulatory Visit (HOSPITAL_COMMUNITY): Payer: Self-pay

## 2021-04-24 ENCOUNTER — Ambulatory Visit (HOSPITAL_COMMUNITY): Payer: Self-pay

## 2021-04-25 ENCOUNTER — Ambulatory Visit (HOSPITAL_COMMUNITY): Payer: Self-pay

## 2021-04-26 ENCOUNTER — Ambulatory Visit (HOSPITAL_COMMUNITY)
Admission: RE | Admit: 2021-04-26 | Discharge: 2021-04-26 | Disposition: A | Discharge status: Home / Self Care | Payer: Medicaid Other | Source: Ambulatory Visit | Attending: Family Medicine | Admitting: Family Medicine

## 2021-04-26 ENCOUNTER — Encounter (HOSPITAL_COMMUNITY): Payer: Self-pay

## 2021-04-26 ENCOUNTER — Other Ambulatory Visit: Payer: Self-pay

## 2021-04-26 VITALS — BP 120/88 | HR 113 | Temp 98.3°F | Resp 17

## 2021-04-26 DIAGNOSIS — J069 Acute upper respiratory infection, unspecified: Secondary | ICD-10-CM

## 2021-04-26 DIAGNOSIS — J029 Acute pharyngitis, unspecified: Secondary | ICD-10-CM

## 2021-04-26 DIAGNOSIS — Z20822 Contact with and (suspected) exposure to covid-19: Secondary | ICD-10-CM | POA: Diagnosis not present

## 2021-04-26 LAB — SARS CORONAVIRUS 2 (TAT 6-24 HRS): SARS Coronavirus 2: NEGATIVE

## 2021-04-26 MED ORDER — PROMETHAZINE-DM 6.25-15 MG/5ML PO SYRP: 5.0000 mL | ORAL_SOLUTION | Freq: Four times a day (QID) | ORAL | 0 refills | Status: DC | PRN

## 2021-04-26 NOTE — ED Provider Notes (Signed)
[place]   [id number] 04/26/21 Arrival Time: [place]:  1. Viral URI   2. Sore throat    Discussed typical duration of viral illnesses. COVID testing pending. OTC symptom care as needed. Work note provided.  Meds ordered this encounter  Medications   promethazine-dextromethorphan (PROMETHAZINE-DM) 6.25-15 MG/5ML syrup    Sig: Take 5 mLs by mouth 4 (four) times daily as needed for cough.    Dispense:  118 mL    Refill:  0     Follow-up Information     Vicenta Aly, FNP.   Specialty: Nurse Practitioner Why: As needed. Contact information: [place] [address] [phone]                 Reviewed expectations re: course of current medical issues. Questions answered. Outlined signs and symptoms indicating need for more acute intervention. Understanding verbalized. After Visit Summary given.   SUBJECTIVE: History from: patient. Theresa Mckay is a 41 y.o. female who reports: ST, nasal congestion, fatigue. Denies: fever. Normal PO intake without n/v/d. Daughter woke with ST today. Mild "smoker's cough".  OBJECTIVE:  Vitals:   04/26/21 1401  BP: 120/88  Pulse: (!) 113  Resp: 17  Temp: 98.3 F (36.8 C)  TempSrc: Oral  SpO2: 96%    Tachycardia noted General appearance: alert; no distress Eyes: PERRLA; EOMI; conjunctiva normal HENT: [place]; AT; with nasal congestion; throat with cobblestoning Neck: supple  Lungs: speaks full sentences without difficulty; unlabored Extremities: no edema Skin: warm and dry Neurologic: normal gait Psychological: alert and cooperative; normal mood and affect  Labs: Labs Reviewed  SARS CORONAVIRUS 2 (TAT 6-24 HRS)    Allergies  Allergen Reactions   Latex Hives    Hives, swelling   Olanzapine Other (See Comments)    Becomes psychotic pyschotic breakdown Psychosis    Amoxicillin-Pot Clavulanate Diarrhea, Nausea And Vomiting and Other (See Comments)     "Made me feel like I have the flu"  "Made me feel like I have the flu"   Gabapentin Other (See Comments)    Increased BP Elevates blood pressure   Tramadol Hives, Itching and Rash    rash   Other Rash    Past Medical History:  Diagnosis Date   Anxiety    Arthritis    "hip, left knee, hands" (08/06/2016)   Avascular necrosis ([place])    "hips" (08/06/2016)   Bipolar 1 disorder ([place])    Childhood asthma    Chronic back pain    "all over my back" (08/06/2016)   Chronic bronchitis (HCC)    DDD (degenerative disc disease), cervical    Depression    Elevated liver enzymes    Hip pain    Pneumonia 2015   Pneumonia    PTSD (post-traumatic stress disorder) dx'd 2012   Seizures ([place])    "non epilleptic; not often; usually when they're changing my seizure RX around" (08/06/2016)   Social History   Socioeconomic History   Marital status: Single    Spouse name: Not on file   Number of children: Not on file   Years of education: Not on file   Highest education level: Not on file  Occupational History   Not on file  Tobacco Use   Smoking status: Every Day    Packs/day: 0.50    Years: 18.00    Pack years: 9.00    Types: Cigarettes   Smokeless tobacco: Never  Vaping Use   Vaping  Use: Never used  Substance and Sexual Activity   Alcohol use: Yes    Comment: occasionally   Drug use: Never   Sexual activity: Not Currently  Other Topics Concern   Not on file  Social History Narrative   Not on file   Social Determinants of Health   Financial Resource Strain: Not on file  Food Insecurity: Not on file  Transportation Needs: Not on file  Physical Activity: Not on file  Stress: Not on file  Social Connections: Not on file  Intimate Partner Violence: Not on file   Family History  Problem Relation Age of Onset   Hypertension Mother    Bipolar disorder Father    Aneurysm Father    Obesity Sister    Breast cancer Sister    Cervical cancer Sister    Colon cancer Sister     Bipolar disorder Sister    Diabetes Other    Past Surgical History:  Procedure Laterality Date   DILATION AND CURETTAGE OF UTERUS     JOINT REPLACEMENT     TOTAL HIP ARTHROPLASTY Right 08/06/2016   Procedure: TOTAL HIP ARTHROPLASTY ANTERIOR APPROACH;  Surgeon: Frederik Pear, MD;  Location: [facility];  Service: Orthopedics;  Laterality: Right;   WISDOM TOOTH EXTRACTION       Vanessa Kick, MD 04/26/21 1441

## 2021-04-26 NOTE — Discharge Instructions (Signed)
You have been tested for COVID-19 today. °If your test returns positive, you will receive a phone call from [facility] regarding your results. °Negative test results are not called. °Both positive and negative results area always visible on MyChart. °If you do not have a MyChart account, sign up instructions are provided in your discharge papers. °Please do not hesitate to contact us should you have questions or concerns. ° °

## 2021-04-26 NOTE — ED Triage Notes (Signed)
Since Sunday had sore throat, congestion and believes has a sinus infection.

## 2021-06-01 ENCOUNTER — Ambulatory Visit (HOSPITAL_COMMUNITY): Payer: Self-pay

## 2021-06-02 ENCOUNTER — Other Ambulatory Visit: Payer: Self-pay

## 2021-06-02 ENCOUNTER — Ambulatory Visit (HOSPITAL_COMMUNITY)
Admission: EM | Admit: 2021-06-02 | Discharge: 2021-06-02 | Discharge status: Against Medical Advice | Payer: Medicaid Other

## 2021-06-03 ENCOUNTER — Encounter (HOSPITAL_COMMUNITY): Payer: Self-pay

## 2021-06-03 ENCOUNTER — Ambulatory Visit (HOSPITAL_COMMUNITY)
Admission: EM | Admit: 2021-06-03 | Discharge: 2021-06-03 | Disposition: A | Discharge status: Home / Self Care | Payer: Medicaid Other | Attending: Urgent Care | Admitting: Urgent Care

## 2021-06-03 VITALS — BP 124/80 | HR 78 | Temp 98.0°F | Resp 16

## 2021-06-03 DIAGNOSIS — M545 Low back pain, unspecified: Secondary | ICD-10-CM | POA: Diagnosis not present

## 2021-06-03 MED ORDER — TIZANIDINE HCL 4 MG PO TABS: 4.0000 mg | ORAL_TABLET | Freq: Four times a day (QID) | ORAL | 0 refills | Status: AC | PRN

## 2021-06-03 MED ORDER — MELOXICAM 7.5 MG PO TABS: 7.5000 mg | ORAL_TABLET | Freq: Every day | ORAL | 0 refills | Status: DC
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
## 2021-06-03 NOTE — Discharge Instructions (Signed)
Please stop your ibuprofen and your methocarbamol. We will switch you to tizanidine and meloxicam Your symptoms appear to be musculoskeletal.  Please use moist heat on the area.  Work on range of motion exercises and stretching. If treatment is ineffective, please follow-up with your PCP to consider getting on carisoprodol, however this will require a prior authorization.
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
## 2021-06-07 ENCOUNTER — Encounter (HOSPITAL_COMMUNITY): Payer: Self-pay

## 2021-06-07 ENCOUNTER — Other Ambulatory Visit: Payer: Self-pay

## 2021-06-07 ENCOUNTER — Ambulatory Visit (HOSPITAL_COMMUNITY)
Admission: RE | Admit: 2021-06-07 | Discharge: 2021-06-07 | Disposition: A | Discharge status: Home / Self Care | Payer: Medicaid Other | Source: Ambulatory Visit | Attending: Urgent Care | Admitting: Urgent Care

## 2021-06-07 ENCOUNTER — Telehealth (HOSPITAL_COMMUNITY): Payer: Self-pay | Admitting: Emergency Medicine

## 2021-06-07 VITALS — BP 129/93 | HR 90 | Temp 98.4°F | Resp 17

## 2021-06-07 DIAGNOSIS — J020 Streptococcal pharyngitis: Secondary | ICD-10-CM | POA: Diagnosis not present

## 2021-06-07 DIAGNOSIS — K121 Other forms of stomatitis: Secondary | ICD-10-CM

## 2021-06-07 LAB — POCT RAPID STREP A, ED / UC: Streptococcus, Group A Screen (Direct): POSITIVE — AB

## 2021-06-07 MED ORDER — LIDOCAINE VISCOUS HCL 2 % MT SOLN: 5.0000 mL | Freq: Four times a day (QID) | OROMUCOSAL | 0 refills | Status: AC | PRN

## 2021-06-07 MED ORDER — AZITHROMYCIN 250 MG PO TABS: 250.0000 mg | ORAL_TABLET | Freq: Every day | ORAL | 0 refills | Status: DC

## 2021-06-07 MED ORDER — VALACYCLOVIR HCL 1 G PO TABS: 1000.0000 mg | ORAL_TABLET | Freq: Two times a day (BID) | ORAL | 0 refills | Status: AC

## 2021-06-07 NOTE — Telephone Encounter (Signed)
Pt returned call for results. Pt aware of results and additional RX sent to pharmacy.

## 2021-06-07 NOTE — ED Triage Notes (Signed)
Pt presents with c/o sore throat x 1 week. Pt states she had a fever yesterday and had Wojtkiewicz blisters in her throat.

## 2021-06-07 NOTE — Discharge Instructions (Addendum)
Your symptoms are consistent with an ulcer near your uvula. These are typically related to a virus. However your strep test also was positive. Use the Magic mouthwash as needed, swish and spit.  Use with caution, it will make your tongue numb. Take the medication as prescribed.  Do not share food or beverages with others until it is resolved.

## 2021-06-07 NOTE — ED Provider Notes (Signed)
[facility]    CSN: [id number] Arrival date & time: 06/07/21  1655      History   Chief Complaint Chief Complaint  Patient presents with   Sore Throat   Cough    HPI Dhruthi Fonda is a 41 y.o. female.   Pleasant 41 year old female presents today with concerns of a sore throat.  She states its been bothering her for 3 to 4 days.  She states that 2 days ago she noticed a Rogoff spot on her uvula, but feels that it "popped", because it is not as prominent as it previously was. She felt as though she had a fever previously as well, but that also has improved.  Patient denies any additional symptoms.   Sore Throat   Past Medical History:  Diagnosis Date   Anxiety    Arthritis    "hip, left knee, hands" (08/06/2016)   Avascular necrosis ([place])    "hips" (08/06/2016)   Bipolar 1 disorder ([place])    Childhood asthma    Chronic back pain    "all over my back" (08/06/2016)   Chronic bronchitis (HCC)    DDD (degenerative disc disease), cervical    Depression    Elevated liver enzymes    Hip pain    Pneumonia 2015   Pneumonia    PTSD (post-traumatic stress disorder) dx'd 2012   Seizures ([place])    "non epilleptic; not often; usually when they're changing my seizure RX around" (08/06/2016)    Patient Active Problem List   Diagnosis Date Noted   Arthritis of right hip 08/06/2016   Avascular necrosis of hip, right ([place]) 08/01/2016   Chronic posttraumatic stress disorder 06/19/2013   MDD (major depressive disorder) 06/18/2013   MDD (major depressive disorder), recurrent episode, severe ([facility]) 06/17/2013   Homicidal ideation 06/17/2013    Past Surgical History:  Procedure Laterality Date   DILATION AND CURETTAGE OF UTERUS     JOINT REPLACEMENT     TOTAL HIP ARTHROPLASTY Right 08/06/2016   Procedure: TOTAL HIP ARTHROPLASTY ANTERIOR APPROACH;  Surgeon: Frederik Pear, MD;  Location: [facility];  Service: Orthopedics;  Laterality: Right;   WISDOM TOOTH EXTRACTION      OB  History   No obstetric history on file.      Home Medications    Prior to Admission medications   Medication Sig Start Date End Date Taking? Authorizing Provider  azithromycin (ZITHROMAX) 250 MG tablet Take 1 tablet (250 mg total) by mouth daily. Take first 2 tablets together, then 1 every day until finished. 06/07/21  Yes Tavian Callander L, PA  magic mouthwash (lidocaine, diphenhydrAMINE, alum & mag hydroxide) suspension Swish and spit 5 mLs 4 (four) times daily as needed for mouth pain. 06/07/21  Yes Aster Screws L, PA  valACYclovir (VALTREX) 1000 MG tablet Take 1 tablet (1,000 mg total) by mouth 2 (two) times daily for 7 days. 06/07/21 06/14/21 Yes Ladamien Rammel L, PA  ARIPiprazole (ABILIFY) 15 MG tablet Take 15 mg by mouth daily. 04/12/19   [provider]  busPIRone (BUSPAR) 15 MG tablet Take 15 mg by mouth daily. 04/23/19   [provider]  lisdexamfetamine (VYVANSE) 60 MG capsule Take by mouth.    [provider]  meloxicam (MOBIC) 7.5 MG tablet Take 1 tablet (7.5 mg total) by mouth daily. 06/03/21   Margareth Kanner L, PA  tiZANidine (ZANAFLEX) 4 MG tablet Take 1 tablet (4 mg total) by mouth every 6 (six) hours as needed for muscle spasms. 06/03/21  Elder Davidian L, PA  diphenhydrAMINE (SOMINEX) 25 MG tablet Take by mouth.  02/28/20  [provider]  lamoTRIgine (LAMICTAL) 200 MG tablet Take by mouth.  02/28/20  [provider]  metFORMIN (GLUCOPHAGE) 500 MG tablet Take 500 mg by mouth 2 (two) times daily. 11/22/16 12/11/18  [provider]  misoprostol (CYTOTEC) 100 MCG tablet Place vaginally. 04/21/13 02/28/20  [provider]    Family History Family History  Problem Relation Age of Onset   Hypertension Mother    Bipolar disorder Father    Aneurysm Father    Obesity Sister    Breast cancer Sister    Cervical cancer Sister    Colon cancer Sister    Bipolar disorder Sister    Diabetes Other     Social History Social  History   Tobacco Use   Smoking status: Every Day    Packs/day: 0.50    Years: 18.00    Pack years: 9.00    Types: Cigarettes   Smokeless tobacco: Never  Vaping Use   Vaping Use: Never used  Substance Use Topics   Alcohol use: Yes    Comment: occasionally   Drug use: Never     Allergies   Latex, Olanzapine, Amoxicillin-pot clavulanate, Gabapentin, Tramadol, and Other   Review of Systems Review of Systems  Constitutional:  Positive for fever.  HENT:  Positive for sore throat.   All other systems reviewed and are negative.   Physical Exam Triage Vital Signs ED Triage Vitals [06/07/21 1729]  Enc Vitals Group     BP (!) 129/93     Pulse Rate 90     Resp 17     Temp 98.4 F (36.9 C)     Temp Source Oral     SpO2 98 %     Weight      Height      Head Circumference      Peak Flow      Pain Score 8     Pain Loc      Pain Edu?      Excl. in [place]?    No data found.  Updated Vital Signs BP (!) 129/93 (BP Location: Right Arm)    Pulse 90    Temp 98.4 F (36.9 C) (Oral)    Resp 17    LMP 06/01/2021 (Exact Date)    SpO2 98%   Visual Acuity Right Eye Distance:   Left Eye Distance:   Bilateral Distance:    Right Eye Near:   Left Eye Near:    Bilateral Near:     Physical Exam Vitals and nursing note reviewed.  Constitutional:      General: She is not in acute distress.    Appearance: She is well-developed. She is obese. She is not ill-appearing, toxic-appearing or diaphoretic.  HENT:     Head: Normocephalic and atraumatic.     Right Ear: Tympanic membrane and ear canal normal. No drainage, swelling or tenderness. No middle ear effusion. Tympanic membrane is not erythematous.     Left Ear: Tympanic membrane and ear canal normal. No drainage, swelling or tenderness.  No middle ear effusion. Tympanic membrane is not erythematous.     Nose: No congestion or rhinorrhea.     Mouth/Throat:     Mouth: Mucous membranes are moist. Oral lesions (two ulcerations noted just  lateral of uvula on R) present.     Pharynx: Uvula midline. Posterior oropharyngeal erythema present. No pharyngeal swelling, oropharyngeal exudate or  uvula swelling.     Tonsils: No tonsillar exudate.  Eyes:     Conjunctiva/sclera: Conjunctivae normal.     Pupils: Pupils are equal, round, and reactive to light.  Neck:     Thyroid: No thyromegaly.  Cardiovascular:     Rate and Rhythm: Normal rate and regular rhythm.     Heart sounds: Normal heart sounds. No murmur heard.   No friction rub. No gallop.  Pulmonary:     Effort: Pulmonary effort is normal. No respiratory distress.     Breath sounds: Normal breath sounds. No stridor. No wheezing, rhonchi or rales.  Chest:     Chest wall: No tenderness.  Abdominal:     General: There is no distension.     Palpations: Abdomen is soft.     Tenderness: There is no abdominal tenderness. There is no rebound.  Musculoskeletal:     Cervical back: Normal range of motion and neck supple.  Lymphadenopathy:     Cervical: No cervical adenopathy.  Skin:    General: Skin is warm.     Capillary Refill: Capillary refill takes less than 2 seconds.     Findings: No erythema or rash.  Neurological:     General: No focal deficit present.     Mental Status: She is alert and oriented to person, place, and time.  Psychiatric:        Mood and Affect: Mood normal.     UC Treatments / Results  Labs (all labs ordered are listed, but only abnormal results are displayed) Labs Reviewed  POCT RAPID STREP A, ED / UC - Abnormal; Notable for the following components:      Result Value   Streptococcus, Group A Screen (Direct) POSITIVE (*)    All other components within normal limits    EKG   Radiology No results found.  Procedures Procedures (including critical care time)  Medications Ordered in UC Medications - No data to display  Initial Impression / Assessment and Plan / UC Course  I have reviewed the triage vital signs and the nursing  notes.  Pertinent labs & imaging results that were available during my care of the patient were reviewed by me and considered in my medical decision making (see chart for details).     Ulcer of mouth -treatment options discussed with patient, she is requesting Magic mouthwash and antiviral Strep pharyngitis -we will start antibiotics.  Patient has allergies to penicillins, therefore will cover with azithromycin.  Final Clinical Impressions(s) / UC Diagnoses   Final diagnoses:  Ulcer of mouth  Strep pharyngitis     Discharge Instructions      Your symptoms are consistent with an ulcer near your uvula. These are typically related to a virus. However your strep test also was positive. Use the Magic mouthwash as needed, swish and spit.  Use with caution, it will make your tongue numb. Take the medication as prescribed.  Do not share food or beverages with others until it is resolved.    ED Prescriptions     Medication Sig Dispense Auth. Provider   valACYclovir (VALTREX) 1000 MG tablet Take 1 tablet (1,000 mg total) by mouth 2 (two) times daily for 7 days. 14 tablet Chloey Ricard L, PA   magic mouthwash (lidocaine, diphenhydrAMINE, alum & mag hydroxide) suspension Swish and spit 5 mLs 4 (four) times daily as needed for mouth pain. 180 mL Jamonta Goerner L, PA   azithromycin (ZITHROMAX) 250 MG tablet Take 1 tablet (250 mg total)  by mouth daily. Take first 2 tablets together, then 1 every day until finished. 6 tablet Brodin Gelpi L, [place]      PDMP not reviewed this encounter.   Chaney Malling, [place] 06/07/21 2100

## 2021-07-21 ENCOUNTER — Encounter (HOSPITAL_COMMUNITY): Payer: Self-pay

## 2021-07-21 ENCOUNTER — Ambulatory Visit (HOSPITAL_COMMUNITY)
Admission: RE | Admit: 2021-07-21 | Discharge: 2021-07-21 | Disposition: A | Discharge status: Home / Self Care | Payer: Medicaid Other | Source: Ambulatory Visit | Attending: Internal Medicine | Admitting: Internal Medicine

## 2021-07-21 VITALS — BP 125/88 | HR 91 | Temp 98.9°F | Resp 16

## 2021-07-21 DIAGNOSIS — R062 Wheezing: Secondary | ICD-10-CM | POA: Insufficient documentation

## 2021-07-21 DIAGNOSIS — J069 Acute upper respiratory infection, unspecified: Secondary | ICD-10-CM | POA: Insufficient documentation

## 2021-07-21 DIAGNOSIS — Z20822 Contact with and (suspected) exposure to covid-19: Secondary | ICD-10-CM | POA: Diagnosis not present

## 2021-07-21 MED ORDER — BENZONATATE 100 MG PO CAPS: 100.0000 mg | ORAL_CAPSULE | Freq: Three times a day (TID) | ORAL | 0 refills | Status: AC | PRN

## 2021-07-21 MED ORDER — ALBUTEROL SULFATE HFA 108 (90 BASE) MCG/ACT IN AERS: 1.0000 | INHALATION_SPRAY | Freq: Four times a day (QID) | RESPIRATORY_TRACT | 0 refills | Status: AC | PRN

## 2021-07-21 NOTE — ED Provider Notes (Addendum)
?[facility] ? ? ? ?CSN: [id number] ?Arrival date & time: 07/21/21  1145 ? ? ?  ? ?History   ?Chief Complaint ?Chief Complaint  ?Patient presents with  ? Cough  ?  Entered by patient  ? Appointment  ? ? ?HPI ?Theresa Mckay is a 41 y.o. female.  ? ?Patient presents with cough and runny nose that has been present for 2 days.  Patient also reports that she felt feverish but did not take temperature with thermometer.  Her daughter has similar symptoms currently.  Denies chest pain, shortness of breath, sore throat, ear pain, nausea, vomiting, diarrhea, abdominal pain.  Patient has taken allergy medication with minimal improvement in symptoms.  Patient does report history of asthma. ? ? ?Cough ? ?Past Medical History:  ?Diagnosis Date  ? Anxiety   ? Arthritis   ? "hip, left knee, hands" (08/06/2016)  ? Avascular necrosis (HCC)   ? "hips" (08/06/2016)  ? Bipolar 1 disorder (HCC)   ? Childhood asthma   ? Chronic back pain   ? "all over my back" (08/06/2016)  ? Chronic bronchitis (HCC)   ? DDD (degenerative disc disease), cervical   ? Depression   ? Elevated liver enzymes   ? Hip pain   ? Pneumonia 2015  ? Pneumonia   ? PTSD (post-traumatic stress disorder) dx'd 2012  ? Seizures (HCC)   ? "non epilleptic; not often; usually when they're changing my seizure RX around" (08/06/2016)  ? ? ?Patient Active Problem List  ? Diagnosis Date Noted  ? Arthritis of right hip 08/06/2016  ? Avascular necrosis of hip, right (HCC) 08/01/2016  ? Chronic posttraumatic stress disorder 06/19/2013  ? MDD (major depressive disorder) 06/18/2013  ? MDD (major depressive disorder), recurrent episode, severe (HCC) 06/17/2013  ? Homicidal ideation 06/17/2013  ? ? ?Past Surgical History:  ?Procedure Laterality Date  ? DILATION AND CURETTAGE OF UTERUS    ? JOINT REPLACEMENT    ? TOTAL HIP ARTHROPLASTY Right 08/06/2016  ? Procedure: TOTAL HIP ARTHROPLASTY ANTERIOR APPROACH;  Surgeon: Gean Birchwood, MD;  Location: [facility];  Service: Orthopedics;   Laterality: Right;  ? WISDOM TOOTH EXTRACTION    ? ? ?OB History   ?No obstetric history on file. ?  ? ? ? ?Home Medications   ? ?Prior to Admission medications   ?Medication Sig Start Date End Date Taking? Authorizing Provider  ?albuterol (VENTOLIN HFA) 108 (90 Base) MCG/ACT inhaler Inhale 1-2 puffs into the lungs every 6 (six) hours as needed for wheezing or shortness of breath. 07/21/21  Yes Randa Riss, Acie Fredrickson, FNP  ?benzonatate (TESSALON) 100 MG capsule Take 1 capsule (100 mg total) by mouth every 8 (eight) hours as needed for cough. 07/21/21  Yes Gustavus Bryant, FNP  ?ARIPiprazole (ABILIFY) 15 MG tablet Take 15 mg by mouth daily. 04/12/19   [provider]  ?azithromycin (ZITHROMAX) 250 MG tablet Take 1 tablet (250 mg total) by mouth daily. Take first 2 tablets together, then 1 every day until finished. 06/07/21   Crain, Whitney L, PA  ?busPIRone (BUSPAR) 15 MG tablet Take 15 mg by mouth daily. 04/23/19   [provider]  ?lisdexamfetamine (VYVANSE) 60 MG capsule Take by mouth.    [provider]  ?magic mouthwash (lidocaine, diphenhydrAMINE, alum & mag hydroxide) suspension Swish and spit 5 mLs 4 (four) times daily as needed for mouth pain. 06/07/21   Crain, Alphonzo Lemmings L, PA  ?meloxicam (MOBIC) 7.5 MG tablet Take 1 tablet (7.5 mg total) by  mouth daily. 06/03/21   Crain, Alphonzo LemmingsWhitney L, PA  ?tiZANidine (ZANAFLEX) 4 MG tablet Take 1 tablet (4 mg total) by mouth every 6 (six) hours as needed for muscle spasms. 06/03/21   Maretta Beesrain, Whitney L, PA  ?diphenhydrAMINE (SOMINEX) 25 MG tablet Take by mouth.  02/28/20  [provider]  ?lamoTRIgine (LAMICTAL) 200 MG tablet Take by mouth.  02/28/20  [provider]  ?metFORMIN (GLUCOPHAGE) 500 MG tablet Take 500 mg by mouth 2 (two) times daily. 11/22/16 12/11/18  [provider]  ?misoprostol (CYTOTEC) 100 MCG tablet Place vaginally. 04/21/13 02/28/20  [provider]  ? ? ?Family History ?Family History  ?Problem Relation Age of Onset  ?  Hypertension Mother   ? Bipolar disorder Father   ? Aneurysm Father   ? Obesity Sister   ? Breast cancer Sister   ? Cervical cancer Sister   ? Colon cancer Sister   ? Bipolar disorder Sister   ? Diabetes Other   ? ? ?Social History ?Social History  ? ?Tobacco Use  ? Smoking status: Every Day  ?  Packs/day: 0.50  ?  Years: 18.00  ?  Pack years: 9.00  ?  Types: Cigarettes  ? Smokeless tobacco: Never  ?Vaping Use  ? Vaping Use: Never used  ?Substance Use Topics  ? Alcohol use: Yes  ?  Comment: occasionally  ? Drug use: Never  ? ? ? ?Allergies   ?Latex, Olanzapine, Amoxicillin-pot clavulanate, Gabapentin, Tramadol, and Other ? ? ?Review of Systems ?Review of Systems ?Per HPI ? ?Physical Exam ?Triage Vital Signs ?ED Triage Vitals  ?Enc Vitals Group  ?   BP 07/21/21 1235 125/88  ?   Pulse Rate 07/21/21 1235 91  ?   Resp 07/21/21 1235 16  ?   Temp 07/21/21 1235 98.9 ?F (37.2 ?C)  ?   Temp Source 07/21/21 1235 Oral  ?   SpO2 07/21/21 1235 98 %  ?   Weight --   ?   Height --   ?   Head Circumference --   ?   Peak Flow --   ?   Pain Score 07/21/21 1233 0  ?   Pain Loc --   ?   Pain Edu? --   ?   Excl. in GC? --   ? ?No data found. ? ?Updated Vital Signs ?BP 125/88 (BP Location: Left Arm)   Pulse 91   Temp 98.9 ?F (37.2 ?C) (Oral)   Resp 16   LMP  (LMP Unknown)   SpO2 98%  ? ?Visual Acuity ?Right Eye Distance:   ?Left Eye Distance:   ?Bilateral Distance:   ? ?Right Eye Near:   ?Left Eye Near:    ?Bilateral Near:    ? ?Physical Exam ?Constitutional:   ?   General: She is not in acute distress. ?   Appearance: Normal appearance. She is not toxic-appearing or diaphoretic.  ?HENT:  ?   Head: Normocephalic and atraumatic.  ?   Right Ear: Tympanic membrane and ear canal normal.  ?   Left Ear: Tympanic membrane and ear canal normal.  ?   Nose: Congestion present.  ?   Mouth/Throat:  ?   Mouth: Mucous membranes are moist.  ?   Pharynx: No posterior oropharyngeal erythema.  ?Eyes:  ?   Extraocular Movements: Extraocular movements  intact.  ?   Conjunctiva/sclera: Conjunctivae normal.  ?   Pupils: Pupils are equal, round, and reactive to light.  ?Cardiovascular:  ?  Rate and Rhythm: Normal rate and regular rhythm.  ?   Pulses: Normal pulses.  ?   Heart sounds: Normal heart sounds.  ?Pulmonary:  ?   Effort: Pulmonary effort is normal. No respiratory distress.  ?   Breath sounds: No stridor. Wheezing present. No rhonchi or rales.  ?Abdominal:  ?   General: Abdomen is flat. Bowel sounds are normal.  ?   Palpations: Abdomen is soft.  ?Musculoskeletal:     ?   General: Normal range of motion.  ?   Cervical back: Normal range of motion.  ?Skin: ?   General: Skin is warm and dry.  ?Neurological:  ?   General: No focal deficit present.  ?   Mental Status: She is alert and oriented to person, place, and time. Mental status is at baseline.  ?Psychiatric:     ?   Mood and Affect: Mood normal.     ?   Behavior: Behavior normal.  ? ? ? ?UC Treatments / Results  ?Labs ?(all labs ordered are listed, but only abnormal results are displayed) ?Labs Reviewed  ?SARS CORONAVIRUS 2 (TAT 6-24 HRS)  ? ? ?EKG ? ? ?Radiology ?No results found. ? ?Procedures ?Procedures (including critical care time) ? ?Medications Ordered in UC ?Medications - No data to display ? ?Initial Impression / Assessment and Plan / UC Course  ?I have reviewed the triage vital signs and the nursing notes. ? ?Pertinent labs & imaging results that were available during my care of the patient were reviewed by me and considered in my medical decision making (see chart for details). ? ?  ? ?Patient presents with symptoms likely from a viral upper respiratory infection. Differential includes bacterial pneumonia, sinusitis, allergic rhinitis, COVID-19, flu. Do not suspect underlying cardiopulmonary process. Symptoms seem unlikely related to ACS, CHF or COPD exacerbations, pneumonia, pneumothorax. Patient is nontoxic appearing and not in need of emergent medical intervention.  COVID test  pending. ? ?Recommended symptom control with over the counter medications.  Albuterol to help alleviate mild wheezing that was noted on exam.  Offered prednisone but patient reports that she is not allowed to take prednisone due

## 2021-07-21 NOTE — Discharge Instructions (Signed)
It appears that you have a viral upper respiratory infection that should run its course and self resolve in the next few days.  No antibiotics are needed at this time.  You have been prescribed albuterol to take as needed for wheezing or shortness of breath.  A cough medication has also been prescribed for you.  Please follow-up if symptoms persist or worsen. ?

## 2021-07-21 NOTE — ED Triage Notes (Signed)
Pt presents with a cough x 2 days and runny nose. Pt states she has had fever.  ?

## 2021-07-22 LAB — SARS CORONAVIRUS 2 (TAT 6-24 HRS): SARS Coronavirus 2: NEGATIVE

## 2021-07-24 ENCOUNTER — Ambulatory Visit (HOSPITAL_COMMUNITY)
Admission: RE | Admit: 2021-07-24 | Discharge: 2021-07-24 | Disposition: A | Discharge status: Home / Self Care | Payer: Medicaid Other | Source: Ambulatory Visit | Attending: Family Medicine | Admitting: Family Medicine

## 2021-07-24 ENCOUNTER — Ambulatory Visit (HOSPITAL_COMMUNITY): Payer: Self-pay

## 2021-07-24 ENCOUNTER — Encounter (HOSPITAL_COMMUNITY): Payer: Self-pay

## 2021-07-24 VITALS — BP 120/83 | HR 101 | Temp 98.2°F | Resp 17

## 2021-07-24 DIAGNOSIS — J209 Acute bronchitis, unspecified: Secondary | ICD-10-CM

## 2021-07-24 MED ORDER — PROMETHAZINE-DM 6.25-15 MG/5ML PO SYRP: 5.0000 mL | ORAL_SOLUTION | Freq: Four times a day (QID) | ORAL | 0 refills | Status: AC | PRN

## 2021-07-24 MED ORDER — DOXYCYCLINE HYCLATE 100 MG PO CAPS: 100.0000 mg | ORAL_CAPSULE | Freq: Two times a day (BID) | ORAL | 0 refills | Status: AC

## 2021-07-24 NOTE — ED Triage Notes (Signed)
Pt presents with ongoing non productive cough and congestion that is unrelieved with prescribed medication X 4 days. ?

## 2021-07-24 NOTE — ED Provider Notes (Signed)
?[facility] ? ? ? ?CSN: [id number] ?Arrival date & time: 07/24/21  1631 ? ? ?  ? ?History   ?Chief Complaint ?Chief Complaint  ?Patient presents with  ? APPOINTMENT: Cough  ? ? ?HPI ?Theresa BondsKristina Mckay is a 41 y.o. female.  ? ?HPI ?Patient presents today with worsening of chest congestion, wheezing and cough.  Patient is a daily smoker and reports that recently she gets bronchitis.  She was seen here 2 days ago and prescribed albuterol and benzonatate Perles.  She denies any relief and reports her symptoms have actually worsened over the last few days.  She is unable to tolerate any type of steroids because she suffers from avascular necrosis. ?She also reports that typically an antibiotic and cough medicine can raise her symptoms.  She is afebrile. ?Past Medical History:  ?Diagnosis Date  ? Anxiety   ? Arthritis   ? "hip, left knee, hands" (08/06/2016)  ? Avascular necrosis (HCC)   ? "hips" (08/06/2016)  ? Bipolar 1 disorder (HCC)   ? Childhood asthma   ? Chronic back pain   ? "all over my back" (08/06/2016)  ? Chronic bronchitis (HCC)   ? DDD (degenerative disc disease), cervical   ? Depression   ? Elevated liver enzymes   ? Hip pain   ? Pneumonia 2015  ? Pneumonia   ? PTSD (post-traumatic stress disorder) dx'd 2012  ? Seizures (HCC)   ? "non epilleptic; not often; usually when they're changing my seizure RX around" (08/06/2016)  ? ? ?Patient Active Problem List  ? Diagnosis Date Noted  ? Arthritis of right hip 08/06/2016  ? Avascular necrosis of hip, right (HCC) 08/01/2016  ? Chronic posttraumatic stress disorder 06/19/2013  ? MDD (major depressive disorder) 06/18/2013  ? MDD (major depressive disorder), recurrent episode, severe (HCC) 06/17/2013  ? Homicidal ideation 06/17/2013  ? ? ?Past Surgical History:  ?Procedure Laterality Date  ? DILATION AND CURETTAGE OF UTERUS    ? JOINT REPLACEMENT    ? TOTAL HIP ARTHROPLASTY Right 08/06/2016  ? Procedure: TOTAL HIP ARTHROPLASTY ANTERIOR APPROACH;  Surgeon: Gean BirchwoodFrank  Rowan, MD;  Location: [facility];  Service: Orthopedics;  Laterality: Right;  ? WISDOM TOOTH EXTRACTION    ? ? ?OB History   ?No obstetric history on file. ?  ? ? ? ?Home Medications   ? ?Prior to Admission medications   ?Medication Sig Start Date End Date Taking? Authorizing Provider  ?albuterol (VENTOLIN HFA) 108 (90 Base) MCG/ACT inhaler Inhale 1-2 puffs into the lungs every 6 (six) hours as needed for wheezing or shortness of breath. 07/21/21   Gustavus BryantMound, Haley E, FNP  ?ARIPiprazole (ABILIFY) 15 MG tablet Take 15 mg by mouth daily. 04/12/19   [provider]  ?azithromycin (ZITHROMAX) 250 MG tablet Take 1 tablet (250 mg total) by mouth daily. Take first 2 tablets together, then 1 every day until finished. 06/07/21   Crain, Alphonzo LemmingsWhitney L, PA  ?benzonatate (TESSALON) 100 MG capsule Take 1 capsule (100 mg total) by mouth every 8 (eight) hours as needed for cough. 07/21/21   Gustavus BryantMound, Haley E, FNP  ?busPIRone (BUSPAR) 15 MG tablet Take 15 mg by mouth daily. 04/23/19   [provider]  ?lisdexamfetamine (VYVANSE) 60 MG capsule Take by mouth.    [provider]  ?magic mouthwash (lidocaine, diphenhydrAMINE, alum & mag hydroxide) suspension Swish and spit 5 mLs 4 (four) times daily as needed for mouth pain. 06/07/21   Crain, Alphonzo LemmingsWhitney L, PA  ?meloxicam (MOBIC) 7.5 MG  tablet Take 1 tablet (7.5 mg total) by mouth daily. 06/03/21   Crain, Alphonzo Lemmings L, PA  ?tiZANidine (ZANAFLEX) 4 MG tablet Take 1 tablet (4 mg total) by mouth every 6 (six) hours as needed for muscle spasms. 06/03/21   Maretta Bees, PA  ?diphenhydrAMINE (SOMINEX) 25 MG tablet Take by mouth.  02/28/20  [provider]  ?lamoTRIgine (LAMICTAL) 200 MG tablet Take by mouth.  02/28/20  [provider]  ?metFORMIN (GLUCOPHAGE) 500 MG tablet Take 500 mg by mouth 2 (two) times daily. 11/22/16 12/11/18  [provider]  ?misoprostol (CYTOTEC) 100 MCG tablet Place vaginally. 04/21/13 02/28/20  [provider]  ? ? ?Family  History ?Family History  ?Problem Relation Age of Onset  ? Hypertension Mother   ? Bipolar disorder Father   ? Aneurysm Father   ? Obesity Sister   ? Breast cancer Sister   ? Cervical cancer Sister   ? Colon cancer Sister   ? Bipolar disorder Sister   ? Diabetes Other   ? ? ?Social History ?Social History  ? ?Tobacco Use  ? Smoking status: Every Day  ?  Packs/day: 0.50  ?  Years: 18.00  ?  Pack years: 9.00  ?  Types: Cigarettes  ? Smokeless tobacco: Never  ?Vaping Use  ? Vaping Use: Never used  ?Substance Use Topics  ? Alcohol use: Yes  ?  Comment: occasionally  ? Drug use: Never  ? ? ? ?Allergies   ?Latex, Olanzapine, Amoxicillin-pot clavulanate, Gabapentin, Tramadol, and Other ? ? ?Review of Systems ?Review of Systems ?Pertinent negatives listed in HPI  ? ?Physical Exam ?Triage Vital Signs ?ED Triage Vitals [07/24/21 1649]  ?Enc Vitals Group  ?   BP 120/83  ?   Pulse Rate (!) 101  ?   Resp 17  ?   Temp 98.2 ?F (36.8 ?C)  ?   Temp Source Oral  ?   SpO2 100 %  ?   Weight   ?   Height   ?   Head Circumference   ?   Peak Flow   ?   Pain Score 2  ?   Pain Loc   ?   Pain Edu?   ?   Excl. in GC?   ? ?No data found. ? ?Updated Vital Signs ?BP 120/83 (BP Location: Right Arm)   Pulse (!) 101   Temp 98.2 ?F (36.8 ?C) (Oral)   Resp 17   LMP  (LMP Unknown)   SpO2 100%  ? ?Visual Acuity ?Right Eye Distance:   ?Left Eye Distance:   ?Bilateral Distance:   ? ?Right Eye Near:   ?Left Eye Near:    ?Bilateral Near:    ? ?Physical Exam ?Vitals reviewed.  ?Constitutional:   ?   Appearance: Normal appearance.  ?HENT:  ?   Head: Atraumatic.  ?   Right Ear: Tympanic membrane normal.  ?   Left Ear: Tympanic membrane normal.  ?   Nose: Congestion and rhinorrhea present.  ?Eyes:  ?   Extraocular Movements: Extraocular movements intact.  ?   Pupils: Pupils are equal, round, and reactive to light.  ?Cardiovascular:  ?   Rate and Rhythm: Normal rate and regular rhythm.  ?Pulmonary:  ?   Effort: Pulmonary effort is normal.  ?   Breath  sounds: Wheezing present.  ?Musculoskeletal:     ?   General: Normal range of motion.  ?   Cervical back: Normal range of motion.  ?Lymphadenopathy:  ?  Cervical: No cervical adenopathy.  ?Skin: ?   General: Skin is warm and dry.  ?   Capillary Refill: Capillary refill takes less than 2 seconds.  ?Neurological:  ?   General: No focal deficit present.  ?   Mental Status: She is alert.  ? ? ? ?UC Treatments / Results  ?Labs ?(all labs ordered are listed, but only abnormal results are displayed) ?Labs Reviewed - No data to display ? ?EKG ? ? ?Radiology ?No results found. ? ?Procedures ?Procedures (including critical care time) ? ?Medications Ordered in UC ?Medications - No data to display ? ?Initial Impression / Assessment and Plan / UC Course  ?I have reviewed the triage vital signs and the nursing notes. ? ?Pertinent labs & imaging results that were available during my care of the patient were reviewed by me and considered in my medical decision making (see chart for details). ? ? Acute bronchitis  ?Continue albuterol and benzonatate that was prescribed during your prior visit.  Adding doxycycline and Promethazine DM for cough.  Follow-up with primary care provider if your symptoms do not readily improve.  Complete all medication as prescribed.  If symptoms become severe go immediately to the ER. ?Final diagnoses:  ?Acute bronchitis, unspecified organism  ? ?Discharge Instructions   ?None ?  ? ?ED Prescriptions   ? ? Medication Sig Dispense Auth. Provider  ? doxycycline (VIBRAMYCIN) 100 MG capsule Take 1 capsule (100 mg total) by mouth 2 (two) times daily. 20 capsule Bing Neighbors, FNP  ? promethazine-dextromethorphan (PROMETHAZINE-DM) 6.25-15 MG/5ML syrup Take 5 mLs by mouth 4 (four) times daily as needed for cough. 140 mL Bing Neighbors, FNP  ? ?  ? ?PDMP not reviewed this encounter. ?  ?Bing Neighbors, FNP ?07/28/21 1453 ? ?

## 2021-09-05 ENCOUNTER — Encounter (HOSPITAL_COMMUNITY): Payer: Self-pay

## 2021-09-05 ENCOUNTER — Ambulatory Visit (HOSPITAL_COMMUNITY)
Admission: RE | Admit: 2021-09-05 | Discharge: 2021-09-05 | Disposition: A | Discharge status: Home / Self Care | Payer: Medicaid Other | Source: Ambulatory Visit | Attending: Physician Assistant | Admitting: Physician Assistant

## 2021-09-05 VITALS — BP 108/71 | HR 75 | Temp 98.1°F | Resp 16

## 2021-09-05 DIAGNOSIS — M79604 Pain in right leg: Secondary | ICD-10-CM | POA: Diagnosis not present

## 2021-09-05 DIAGNOSIS — M545 Low back pain, unspecified: Secondary | ICD-10-CM | POA: Diagnosis not present

## 2021-09-05 DIAGNOSIS — M25551 Pain in right hip: Secondary | ICD-10-CM

## 2021-09-05 MED ORDER — METHOCARBAMOL 500 MG PO TABS: 500.0000 mg | ORAL_TABLET | Freq: Two times a day (BID) | ORAL | 0 refills | Status: AC

## 2021-09-05 MED ORDER — MELOXICAM 15 MG PO TABS: 15.0000 mg | ORAL_TABLET | Freq: Every day | ORAL | 0 refills | Status: AC

## 2021-09-05 NOTE — Discharge Instructions (Signed)
Advised to continue using ice to area 10 minutes on 20 minutes off 3-4 times a day.

## 2021-09-05 NOTE — ED Provider Notes (Signed)
[facility]    CSN: [id number] Arrival date & time: 09/05/21  1120      History   Chief Complaint Chief Complaint  Patient presents with   Back Pain    Entered by patient   Hip Pain    HPI Theresa Mckay is a 41 y.o. female.   41 year old Theresa Mckay female presents with right hip pain.  Patient relates that she is status post right hip replacement.  Patient indicates she does get recurrent right lower back and right hip pain when she stands up for long periods of time.  Patient indicates she did do a double 12-hour shift the other day when she started having the lower back pain in the right hip pain.  Patient does indicate that she contacted her PCP and did a virtual visit yesterday, she was issued Zanaflex for muscle spasm.  Patient relates that this medication has not helped so far.  Patient does indicate she does have some pain which started to lower part of the right side of the back and radiates down through the right hip into the lower part of the leg, but there is no weakness or numbness or tingling.  Patient has been using ice alternating with heat but so far has not gotten any improvement improvement from doing so.  Patient desires instructions to provide some relief from her discomfort. Patient does indicate when she was here last that she did receive some type of injection in the buttocks area to help relieve her right hip pain, but the patient was unsure of what that was.  Patient relates she does not believe that it was a steroid injection due to her hip being replaced secondary to avascular necrosis.   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; usually when they're changing my seizure RX around" (08/06/2016)    Patient Active Problem List   Diagnosis Date Noted   Arthritis of right hip 08/06/2016   Avascular necrosis of hip, right (HCC) 08/01/2016   Chronic posttraumatic stress disorder 06/19/2013   MDD (major depressive disorder) 06/18/2013   MDD (major depressive disorder), recurrent episode, severe (HCC) 06/17/2013   Homicidal ideation 06/17/2013    Past Surgical History:  Procedure Laterality Date   DILATION AND CURETTAGE OF UTERUS     JOINT REPLACEMENT     TOTAL HIP ARTHROPLASTY Right 08/06/2016   Procedure: TOTAL HIP ARTHROPLASTY ANTERIOR APPROACH;  Surgeon: Gean Birchwood, MD;  Location: [facility];  Service: Orthopedics;  Laterality: Right;   WISDOM TOOTH EXTRACTION      OB History   No obstetric history on file.      Home Medications    Prior to Admission medications   Medication Sig Start Date End Date Taking? Authorizing Provider  meloxicam (MOBIC) 15 MG tablet Take 1 tablet (15 mg total) by mouth daily. 09/05/21  Yes Fayrene Fearing,  Onalee Hua, PA-C  methocarbamol (ROBAXIN) 500 MG tablet Take 1 tablet (500 mg total) by mouth 2 (two) times daily. 09/05/21  Yes Ellsworth Lennox, PA-C  albuterol (VENTOLIN HFA) 108 (90 Base) MCG/ACT inhaler Inhale 1-2 puffs into the lungs every 6 (six) hours as needed for wheezing or shortness of breath. 07/21/21   Gustavus Bryant, FNP  ARIPiprazole (ABILIFY) 15 MG tablet Take 15 mg by mouth daily. 04/12/19   [provider]  azithromycin (ZITHROMAX) 250 MG tablet Take 1 tablet (250 mg total) by mouth daily. Take first 2 tablets together, then 1 every day until finished. 06/07/21   Crain, Whitney L, PA  benzonatate (TESSALON) 100 MG capsule Take 1 capsule (100 mg total) by mouth every 8 (eight) hours as needed for cough. 07/21/21    Gustavus Bryant, FNP  busPIRone (BUSPAR) 15 MG tablet Take 15 mg by mouth daily. 04/23/19   [provider]  doxycycline (VIBRAMYCIN) 100 MG capsule Take 1 capsule (100 mg total) by mouth 2 (two) times daily. 07/24/21   Bing Neighbors, FNP  lisdexamfetamine (VYVANSE) 60 MG capsule Take by mouth.    [provider]  magic mouthwash (lidocaine, diphenhydrAMINE, alum & mag hydroxide) suspension Swish and spit 5 mLs 4 (four) times daily as needed for mouth pain. 06/07/21   Crain, Whitney L, PA  promethazine-dextromethorphan (PROMETHAZINE-DM) 6.25-15 MG/5ML syrup Take 5 mLs by mouth 4 (four) times daily as needed for cough. 07/24/21   Bing Neighbors, FNP  tiZANidine (ZANAFLEX) 4 MG tablet Take 1 tablet (4 mg total) by mouth every 6 (six) hours as needed for muscle spasms. 06/03/21   Maretta Bees, PA  diphenhydrAMINE (SOMINEX) 25 MG tablet Take by mouth.  02/28/20  [provider]  lamoTRIgine (LAMICTAL) 200 MG tablet Take by mouth.  02/28/20  [provider]  metFORMIN (GLUCOPHAGE) 500 MG tablet Take 500 mg by mouth 2 (two) times daily. 11/22/16 12/11/18  [provider]  misoprostol (CYTOTEC) 100 MCG tablet Place vaginally. 04/21/13 02/28/20  [provider]    Family History Family History  Problem Relation Age of Onset   Hypertension Mother    Bipolar disorder Father    Aneurysm Father    Obesity Sister    Breast cancer Sister    Cervical cancer Sister    Colon cancer Sister    Bipolar disorder Sister    Diabetes Other     Social History Social History   Tobacco Use   Smoking status: Every Day    Packs/day: 0.50    Years: 18.00    Pack years: 9.00    Types: Cigarettes   Smokeless tobacco: Never  Vaping Use   Vaping Use: Never used  Substance Use Topics   Alcohol use: Yes    Comment: occasionally   Drug use: Never     Allergies   Latex, Olanzapine, Amoxicillin-pot clavulanate, Gabapentin, Tramadol, and Other   Review  of Systems Review of Systems  Musculoskeletal:  Positive for back pain (right lower L5 area).    Physical Exam Triage Vital Signs ED Triage Vitals  Enc Vitals Group     BP 09/05/21 1144 108/71     Pulse Rate 09/05/21 1139 75     Resp 09/05/21 1139 16     Temp 09/05/21 1139 98.1 F (36.7 C)     Temp Source 09/05/21 1139 Oral     SpO2 09/05/21 1139 97 %     Weight --  Height --      Head Circumference --      Peak Flow --      Pain Score 09/05/21 1141 8     Pain Loc --      Pain Edu? --      Excl. in GC? --    No data found.  Updated Vital Signs BP 108/71 (BP Location: Left Arm)   Pulse 75   Temp 98.1 F (36.7 C) (Oral)   Resp 16   LMP 09/01/2021 (Exact Date)   SpO2 97%   Visual Acuity Right Eye Distance:   Left Eye Distance:   Bilateral Distance:    Right Eye Near:   Left Eye Near:    Bilateral Near:     Physical Exam Constitutional:      Appearance: Normal appearance.  Musculoskeletal:     Comments: Back: Pain is palpated along the lower L5-S1 right paraspinous area, no redness or swelling. Right hip: There is tenderness along the lateral aspect of the right hip, range of motion of the lower leg is normal, internal/external rotations are normal.  There is some discomfort on full extension and flexion of the right foot.  Strength is normal in the right leg.  Neurological:     Mental Status: She is alert.     UC Treatments / Results  Labs (all labs ordered are listed, but only abnormal results are displayed) Labs Reviewed - No data to display  EKG   Radiology No results found.  Procedures Procedures (including critical care time)  Medications Ordered in UC Medications - No data to display  Initial Impression / Assessment and Plan / UC Course  I have reviewed the triage vital signs and the nursing notes.  Pertinent labs & imaging results that were available during my care of the patient were reviewed by me and considered in my medical  decision making (see chart for details).    Plan: 1.  Advised to continue using ice to area to reduce the pain 10 minutes on 20 minutes off 3-4 times a day. 2.  Advised patient to take the Mobic 15 mg 1 tablet daily 3.  Advised patient to start the Robaxin 1 tablet twice a day for relief of muscle spasms. 4.  Patient advised to follow-up with PCP if symptoms fail to improve. Final Clinical Impressions(s) / UC Diagnoses   Final diagnoses:  Right hip pain  Acute right-sided low back pain without sciatica  Acute leg pain, right     Discharge Instructions      Advised to continue using ice to area 10 minutes on 20 minutes off 3-4 times a day.    ED Prescriptions     Medication Sig Dispense Auth. Provider   methocarbamol (ROBAXIN) 500 MG tablet Take 1 tablet (500 mg total) by mouth 2 (two) times daily. 20 tablet Ellsworth LennoxJames, Hila Bolding, PA-C   meloxicam (MOBIC) 15 MG tablet Take 1 tablet (15 mg total) by mouth daily. 10 tablet Ellsworth LennoxJames, Jovian Lembcke, PA-C      PDMP not reviewed this encounter.   Ellsworth LennoxJames, Mima Cranmore, PA-C 09/05/21 1253

## 2021-09-05 NOTE — ED Triage Notes (Signed)
Pt states right hip pain radiating to lower back. States she had a  hip replacement on that side and she sometimes gets pain when overusing. Took a muscle relaxer yest with some relief.

## 2021-11-04 ENCOUNTER — Encounter (HOSPITAL_COMMUNITY): Payer: Self-pay

## 2021-11-04 ENCOUNTER — Ambulatory Visit (HOSPITAL_COMMUNITY)
Admission: RE | Admit: 2021-11-04 | Discharge: 2021-11-04 | Disposition: A | Discharge status: Home / Self Care | Payer: Medicaid Other | Source: Ambulatory Visit | Attending: Emergency Medicine | Admitting: Emergency Medicine

## 2021-11-04 VITALS — BP 123/81 | HR 92 | Temp 98.3°F | Resp 20

## 2021-11-04 DIAGNOSIS — J069 Acute upper respiratory infection, unspecified: Secondary | ICD-10-CM | POA: Diagnosis present

## 2021-11-04 LAB — POCT RAPID STREP A, ED / UC: Streptococcus, Group A Screen (Direct): NEGATIVE

## 2021-11-04 MED ORDER — AZITHROMYCIN 250 MG PO TABS: 250.0000 mg | ORAL_TABLET | Freq: Every day | ORAL | 0 refills | Status: AC

## 2021-11-04 NOTE — ED Provider Notes (Signed)
[facility]    CSN: [id number] Arrival date & time: 11/04/21  1528      History   Chief Complaint Chief Complaint  Patient presents with   Sore Throat    HPI Theresa Mckay is a 41 y.o. female.   Patient presents with chills, body aches, rhinorrhea, sore throat and a nonproductive cough for 1 day.  Endorses Simmering patch to her uvula which is typically an indication of strep.  No known sick contacts but does work at [employer].  Tolerating food and liquids.  History of anxiety, childhood asthma, chronic bronchitis.  Daily smoker.  Denies fever, nasal congestion, ear pain, shortness of breath or wheezing.   Past Medical History:  Diagnosis Date   Anxiety    Arthritis    "hip, left knee, hands" (08/06/2016)   Avascular necrosis ([place])    "hips" (08/06/2016)   Bipolar 1 disorder ([place])    Childhood asthma    Chronic back pain    "all over my back" (08/06/2016)   Chronic bronchitis (HCC)    DDD (degenerative disc disease), cervical    Depression    Elevated liver enzymes    Hip pain    Pneumonia 2015   Pneumonia    PTSD (post-traumatic stress disorder) dx'd 2012   Seizures ([place])    "non epilleptic; not often; usually when they're changing my seizure RX around" (08/06/2016)    Patient Active Problem List   Diagnosis Date Noted   Arthritis of right hip 08/06/2016   Avascular necrosis of hip, right ([place]) 08/01/2016   Chronic posttraumatic stress disorder 06/19/2013   MDD (major depressive disorder) 06/18/2013   MDD (major depressive disorder), recurrent episode, severe ([place]) 06/17/2013   Homicidal ideation 06/17/2013    Past Surgical History:  Procedure Laterality Date   DILATION AND CURETTAGE OF UTERUS     JOINT REPLACEMENT     TOTAL HIP ARTHROPLASTY Right 08/06/2016   Procedure: TOTAL HIP ARTHROPLASTY ANTERIOR APPROACH;  Surgeon: Frederik Pear, MD;  Location: [facility];  Service: Orthopedics;  Laterality: Right;   WISDOM TOOTH EXTRACTION      OB History   No  obstetric history on file.      Home Medications    Prior to Admission medications   Medication Sig Start Date End Date Taking? Authorizing Provider  ARIPiprazole (ABILIFY) 15 MG tablet Take 15 mg by mouth daily. 04/12/19  Yes [provider]  busPIRone (BUSPAR) 15 MG tablet Take 15 mg by mouth daily. 04/23/19  Yes [provider]  Cetirizine HCl (ZYRTEC PO) Take by mouth.   Yes [provider]  IBUPROFEN PO Take by mouth.   Yes [provider]  lisdexamfetamine (VYVANSE) 60 MG capsule Take by mouth.   Yes [provider]  albuterol (VENTOLIN HFA) 108 (90 Base) MCG/ACT inhaler Inhale 1-2 puffs into the lungs every 6 (six) hours as needed for wheezing or shortness of breath. 07/21/21   Teodora Medici, FNP  azithromycin (ZITHROMAX) 250 MG tablet Take 1 tablet (250 mg total) by mouth daily. Take first 2 tablets together, then 1 every day until finished. 06/07/21   Crain, Whitney L, PA  benzonatate (TESSALON) 100 MG capsule Take 1 capsule (100 mg total) by mouth every 8 (eight) hours as needed for cough. 07/21/21   Teodora Medici, FNP  doxycycline (VIBRAMYCIN) 100 MG capsule Take 1 capsule (100 mg total) by mouth 2 (two) times daily. 07/24/21   Scot Jun, FNP  magic mouthwash (lidocaine,  diphenhydrAMINE, alum & mag hydroxide) suspension Swish and spit 5 mLs 4 (four) times daily as needed for mouth pain. 06/07/21   Crain, Whitney L, PA  meloxicam (MOBIC) 15 MG tablet Take 1 tablet (15 mg total) by mouth daily. 09/05/21   Ellsworth Lennox, PA-C  methocarbamol (ROBAXIN) 500 MG tablet Take 1 tablet (500 mg total) by mouth 2 (two) times daily. 09/05/21   Ellsworth Lennox, PA-C  promethazine-dextromethorphan (PROMETHAZINE-DM) 6.25-15 MG/5ML syrup Take 5 mLs by mouth 4 (four) times daily as needed for cough. 07/24/21   Bing Neighbors, FNP  tiZANidine (ZANAFLEX) 4 MG tablet Take 1 tablet (4 mg total) by mouth every 6 (six) hours as needed for muscle spasms. 06/03/21    Maretta Bees, PA  diphenhydrAMINE (SOMINEX) 25 MG tablet Take by mouth.  02/28/20  [provider]  lamoTRIgine (LAMICTAL) 200 MG tablet Take by mouth.  02/28/20  [provider]  metFORMIN (GLUCOPHAGE) 500 MG tablet Take 500 mg by mouth 2 (two) times daily. 11/22/16 12/11/18  [provider]  misoprostol (CYTOTEC) 100 MCG tablet Place vaginally. 04/21/13 02/28/20  [provider]    Family History Family History  Problem Relation Age of Onset   Hypertension Mother    Bipolar disorder Father    Aneurysm Father    Obesity Sister    Breast cancer Sister    Cervical cancer Sister    Colon cancer Sister    Bipolar disorder Sister    Diabetes Other     Social History Social History   Tobacco Use   Smoking status: Every Day    Packs/day: 0.50    Years: 18.00    Total pack years: 9.00    Types: Cigarettes   Smokeless tobacco: Never  Vaping Use   Vaping Use: Never used  Substance Use Topics   Alcohol use: Yes    Comment: occasionally   Drug use: Never     Allergies   Latex, Olanzapine, Amoxicillin-pot clavulanate, Gabapentin, Tramadol, and Other   Review of Systems Review of Systems  Constitutional:  Positive for chills. Negative for activity change, appetite change, diaphoresis, fatigue, fever and unexpected weight change.  HENT:  Positive for rhinorrhea and sore throat. Negative for congestion, dental problem, drooling, ear discharge, ear pain, facial swelling, hearing loss, mouth sores, nosebleeds, postnasal drip, sinus pressure, sinus pain, sneezing, tinnitus, trouble swallowing and voice change.   Respiratory:  Positive for cough. Negative for apnea, choking, chest tightness, shortness of breath, wheezing and stridor.   Cardiovascular: Negative.   Gastrointestinal: Negative.   Musculoskeletal:  Positive for myalgias. Negative for arthralgias, back pain, gait problem, joint swelling, neck pain and neck stiffness.  Skin: Negative.       Physical Exam Triage Vital Signs ED Triage Vitals  Enc Vitals Group     BP 11/04/21 1550 123/81     Pulse Rate 11/04/21 1550 92     Resp 11/04/21 1550 20     Temp 11/04/21 1550 98.3 F (36.8 C)     Temp Source 11/04/21 1550 Oral     SpO2 11/04/21 1550 99 %     Weight --      Height --      Head Circumference --      Peak Flow --      Pain Score 11/04/21 1552 6     Pain Loc --      Pain Edu? --      Excl. in GC? --    No  data found.  Updated Vital Signs BP 123/81   Pulse 92   Temp 98.3 F (36.8 C) (Oral)   Resp 20   LMP 10/04/2021 (Approximate)   SpO2 99%   Visual Acuity Right Eye Distance:   Left Eye Distance:   Bilateral Distance:    Right Eye Near:   Left Eye Near:    Bilateral Near:     Physical Exam Constitutional:      Appearance: She is well-developed.  HENT:     Head: Normocephalic.     Right Ear: Tympanic membrane and ear canal normal.     Left Ear: Tympanic membrane and ear canal normal.     Nose: Rhinorrhea present. No congestion.     Mouth/Throat:     Mouth: Mucous membranes are moist.     Pharynx: Posterior oropharyngeal erythema present.     Tonsils: No tonsillar exudate. 0 on the right. 0 on the left.  Eyes:     Comments: Exudate to uvula   Cardiovascular:     Rate and Rhythm: Normal rate and regular rhythm.     Heart sounds: Normal heart sounds.  Pulmonary:     Effort: Pulmonary effort is normal.     Breath sounds: Normal breath sounds.  Musculoskeletal:     Cervical back: Normal range of motion and neck supple.  Skin:    General: Skin is warm and dry.  Neurological:     General: No focal deficit present.     Mental Status: She is alert and oriented to person, place, and time.  Psychiatric:        Mood and Affect: Mood normal.        Behavior: Behavior normal.      UC Treatments / Results  Labs (all labs ordered are listed, but only abnormal results are displayed) Labs Reviewed  CULTURE, GROUP A STREP [facility])  POCT  RAPID STREP A, ED / UC    EKG   Radiology No results found.  Procedures Procedures (including critical care time)  Medications Ordered in UC Medications - No data to display  Initial Impression / Assessment and Plan / UC Course  I have reviewed the triage vital signs and the nursing notes.  Pertinent labs & imaging results that were available during my care of the patient were reviewed by me and considered in my medical decision making (see chart for details).  Viral URI cough   Signs are stable and patient is in no signs of distress, O2 saturation 95% on room air and lungs are clear to auscultation, pharynx is mildly erythematous with 1 patch of exudate to the uvula, no tonsillar adenopathy noted, discussed with patient, patient endorses that she frequently has strep and bronchitis and requires antibiotics to resolve symptoms, Z-Pak prescribed prophylactically, may use over-the-counter medications as needed for an additional support, work note given, may follow-up with urgent care as Final Clinical Impressions(s) / UC Diagnoses   Final diagnoses:  None   Discharge Instructions   None    ED Prescriptions   None    PDMP not reviewed this encounter.   Amery, [place], NP 11/04/21 1719

## 2021-11-04 NOTE — Discharge Instructions (Signed)
Your symptoms today are most likely being caused by a virus and should steadily improve in time it can take up to 7 to 10 days before you truly start to see a turnaround however things will get better  Your strep test was negative for bacteria  As you are prone to bronchitis, take azithromycin as directed on package    You can take Tylenol and/or Ibuprofen as needed for fever reduction and pain relief.   For cough: honey 1/2 to 1 teaspoon (you can dilute the honey in water or another fluid).  You can also use guaifenesin and dextromethorphan for cough. You can use a humidifier for chest congestion and cough.  If you don't have a humidifier, you can sit in the bathroom with the hot shower running.      For sore throat: try warm salt water gargles, cepacol lozenges, throat spray, warm tea or water with lemon/honey, popsicles or ice, or OTC cold relief medicine for throat discomfort.   For congestion: take a daily anti-histamine like Zyrtec, Claritin, and a oral decongestant, such as pseudoephedrine.  You can also use Flonase 1-2 sprays in each nostril daily.   It is important to stay hydrated: drink plenty of fluids (water, gatorade/powerade/pedialyte, juices, or teas) to keep your throat moisturized and help further relieve irritation/discomfort.

## 2021-11-04 NOTE — ED Triage Notes (Signed)
C/O sore throat onset yesterday. Reports tonsillar lesion "that I get whenever I have strep". Also c/o general malaise and slight runny nose and cough. Unsure if fevers.

## 2021-11-07 LAB — CULTURE, GROUP A STREP (THRC)

## 2022-01-03 ENCOUNTER — Ambulatory Visit (HOSPITAL_COMMUNITY): Payer: Self-pay
